# Patient Record
Sex: Male | Born: 1935 | Race: White | Hispanic: No | Marital: Married | Smoking: Never smoker
Health system: Southern US, Community
[De-identification: ages and names within clinical notes are randomized; demographics above are authoritative.]

## PROBLEM LIST (undated history)

## (undated) DIAGNOSIS — K219 Gastro-esophageal reflux disease without esophagitis: Secondary | ICD-10-CM

## (undated) DIAGNOSIS — Z87442 Personal history of urinary calculi: Secondary | ICD-10-CM

## (undated) DIAGNOSIS — E782 Mixed hyperlipidemia: Secondary | ICD-10-CM

## (undated) DIAGNOSIS — T884XXA Failed or difficult intubation, initial encounter: Secondary | ICD-10-CM

## (undated) DIAGNOSIS — I2510 Atherosclerotic heart disease of native coronary artery without angina pectoris: Secondary | ICD-10-CM

## (undated) DIAGNOSIS — E785 Hyperlipidemia, unspecified: Secondary | ICD-10-CM

## (undated) DIAGNOSIS — M1990 Unspecified osteoarthritis, unspecified site: Secondary | ICD-10-CM

## (undated) DIAGNOSIS — I10 Essential (primary) hypertension: Secondary | ICD-10-CM

## (undated) DIAGNOSIS — I252 Old myocardial infarction: Secondary | ICD-10-CM

## (undated) HISTORY — DX: Essential (primary) hypertension: I10

## (undated) HISTORY — DX: Old myocardial infarction: I25.2

## (undated) HISTORY — PX: EYE SURGERY: SHX253

## (undated) HISTORY — DX: Hyperlipidemia, unspecified: E78.5

## (undated) HISTORY — PX: NECK SURGERY: SHX720

## (undated) HISTORY — PX: COLONOSCOPY: SHX174

## (undated) HISTORY — DX: Atherosclerotic heart disease of native coronary artery without angina pectoris: I25.10

## (undated) HISTORY — DX: Mixed hyperlipidemia: E78.2

## (undated) HISTORY — PX: BACK SURGERY: SHX140

---

## 1995-05-11 HISTORY — PX: CARDIAC CATHETERIZATION: SHX172

## 1998-11-12 ENCOUNTER — Encounter: Payer: Self-pay | Admitting: Urology

## 1998-11-12 ENCOUNTER — Emergency Department (HOSPITAL_COMMUNITY): Admission: EM | Admit: 1998-11-12 | Discharge: 1998-11-12 | Payer: Self-pay | Admitting: Emergency Medicine

## 1998-11-18 ENCOUNTER — Ambulatory Visit (HOSPITAL_COMMUNITY): Admission: RE | Admit: 1998-11-18 | Discharge: 1998-11-18 | Payer: Self-pay | Admitting: Urology

## 1998-11-18 ENCOUNTER — Encounter: Payer: Self-pay | Admitting: Urology

## 2005-12-18 ENCOUNTER — Emergency Department (HOSPITAL_COMMUNITY): Admission: EM | Admit: 2005-12-18 | Discharge: 2005-12-18 | Payer: Self-pay | Admitting: Emergency Medicine

## 2007-03-18 IMAGING — CT CT T SPINE W/O CM
3 of 4 series · 16 of 33 positions shown, 19 images · non-contrast
Comparison: Thoracic spine radiographs obtained earlier today.

CLINICAL DATA: Back pain following fall. T11 wedge deformity on radiographs
earlier today.

THORACIC SPINE CT WITHOUT CONTRAST
TECHNIQUE: Multidetector CT imaging of the lower thoracic spine was performed. 
Sagittal and coronal plane reformatted images were reconstructed from the axial
CT data, and were also reviewed.

[Series 4: t_spine 2.0 b30s st · axial · 0.23mm/px · z∈[-115,-24]mm · 8 of 85 slices shown, 10 images]
[im 10/85  soft-tissue]
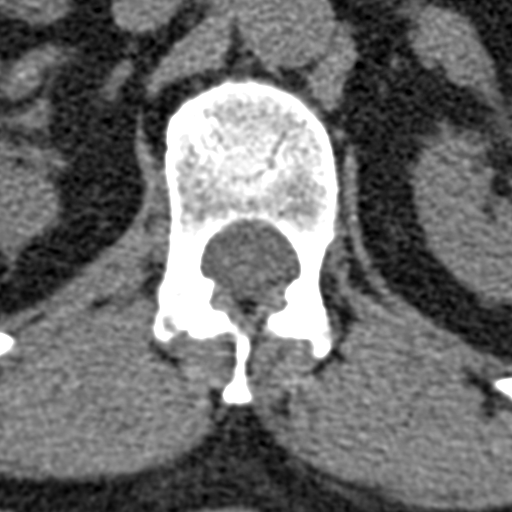
[im 10/85  bone]
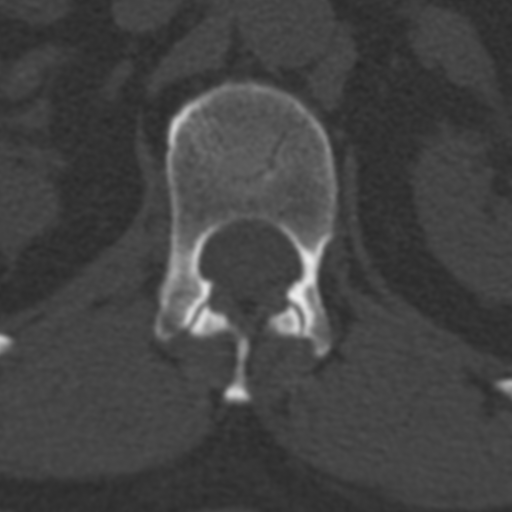
[im 19/85  bone]
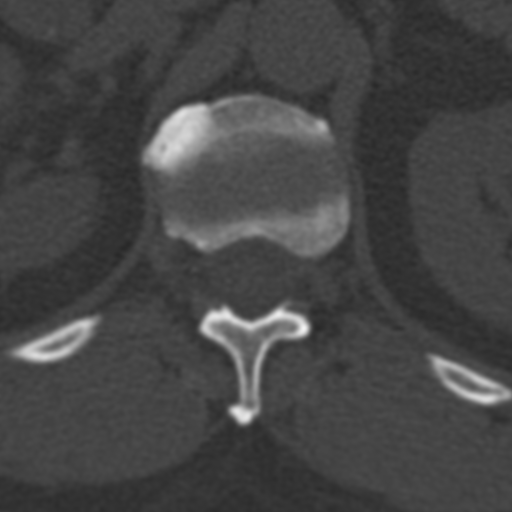
[im 29/85  bone]
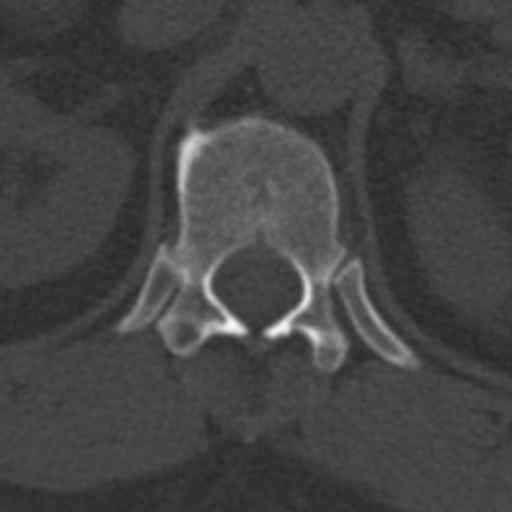
[im 38/85  bone]
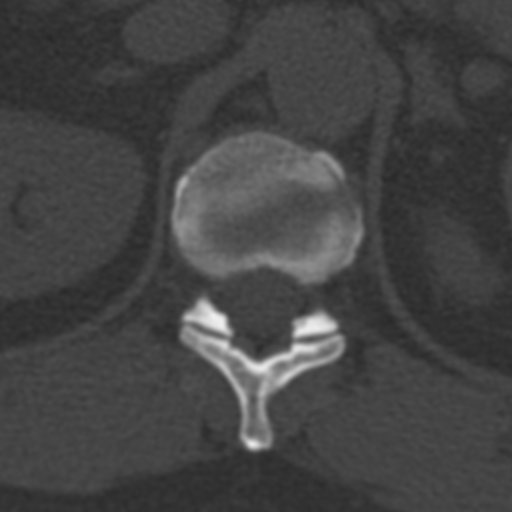
[im 47/85  soft-tissue]
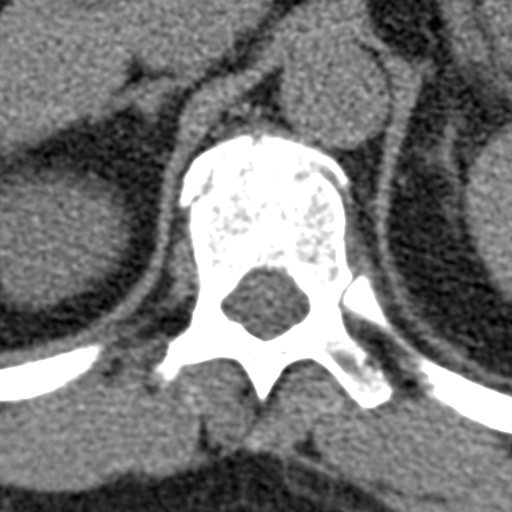
[im 47/85  bone]
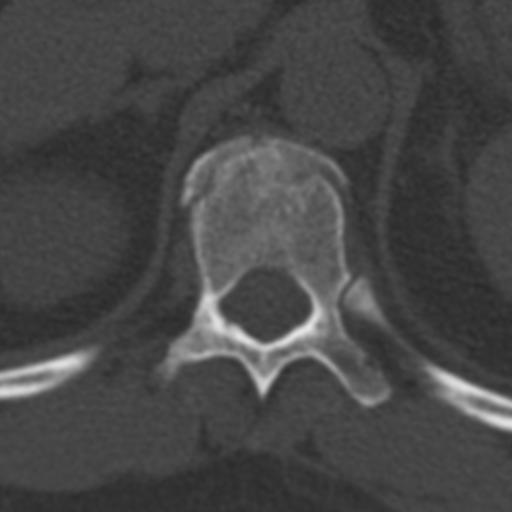
[im 57/85  bone]
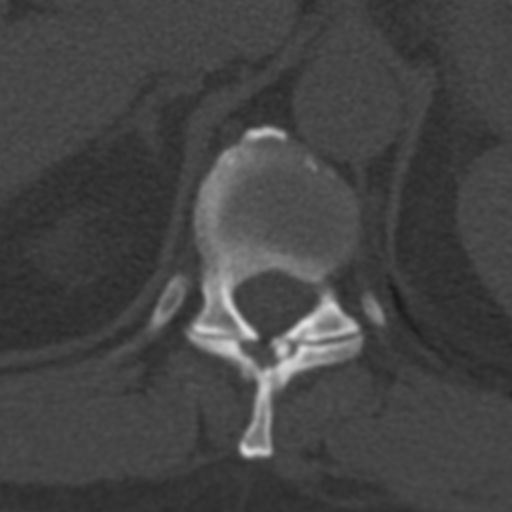
[im 66/85  bone]
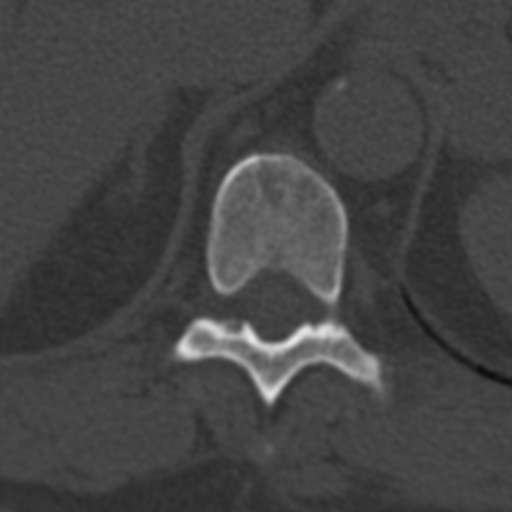
[im 75/85  bone]
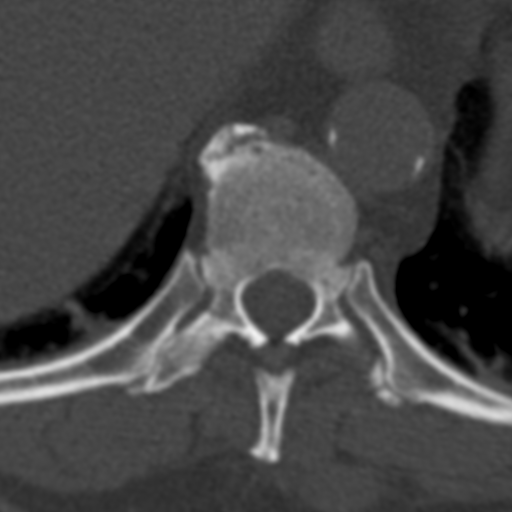

[Series 602: cor · coronal · 0.23mm/px · 3 of 50 slices shown]
[im 10/50  bone]
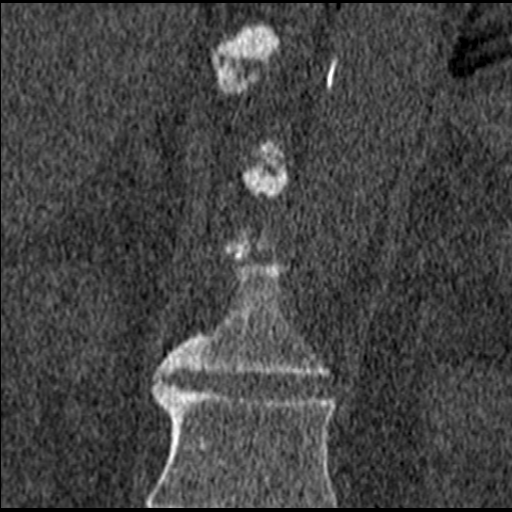
[im 20/50  bone]
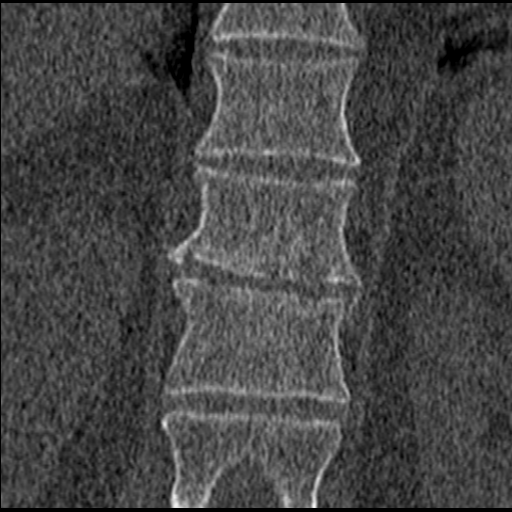
[im 30/50  bone]
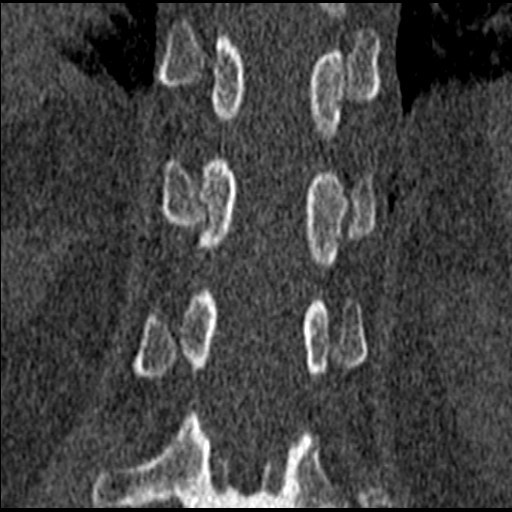

[Series 603: sag · sagittal · 0.23mm/px · 5 of 50 slices shown, 6 images]
[im 17/50  bone]
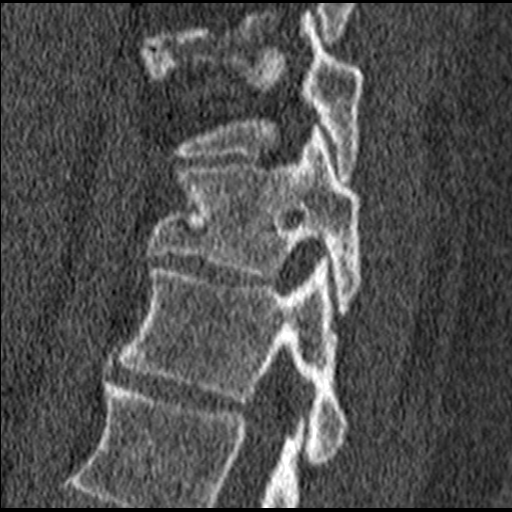
[im 21/50  bone]
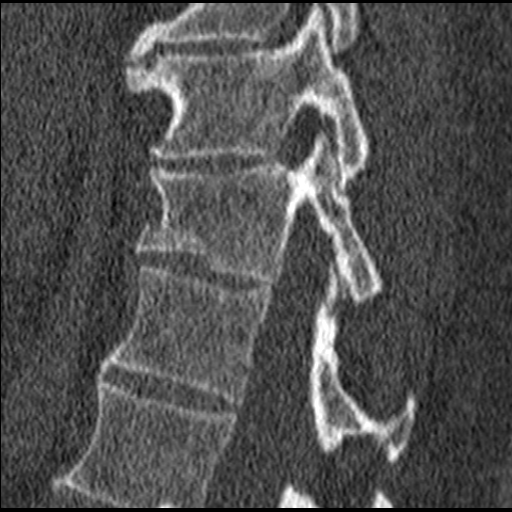
[im 25/50  soft-tissue]
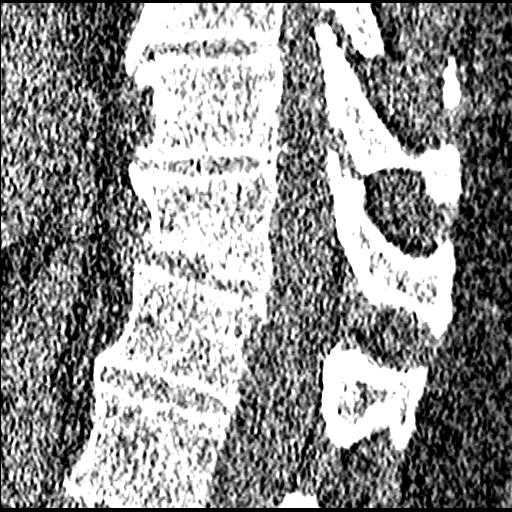
[im 25/50  bone]
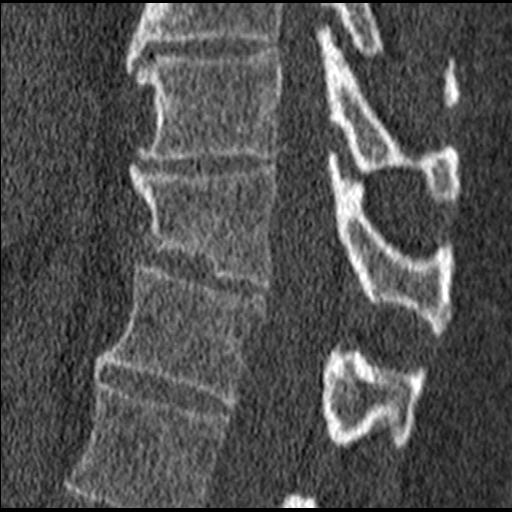
[im 29/50  bone]
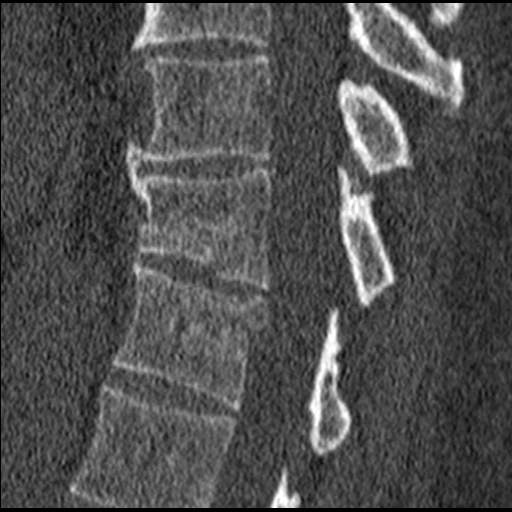
[im 33/50  bone]
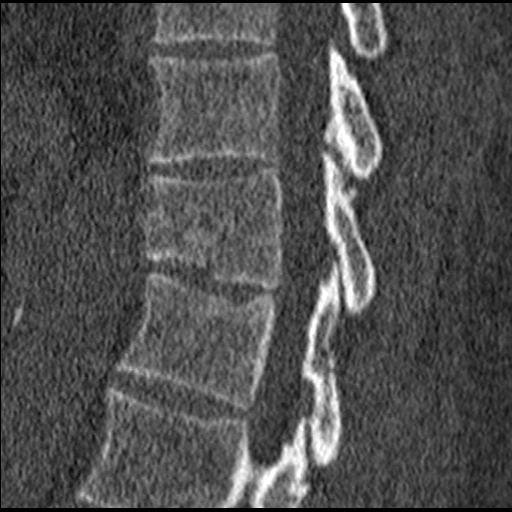

[16 of 33 positions shown; findings below may reference images not displayed]

FINDINGS: Mildly comminuted, acute inferior endplate fracture of the T11
vertebral body anteriorly and on the right. Approximately 15% loss of anterior
height. No bony retropulsion. Anterior spondylosis at multiple levels.

IMPRESSION

Approximately 15% mildly comminuted, acute inferior endplate fracture of the T11
vertebral body without bony retropulsion.

## 2007-07-23 ENCOUNTER — Emergency Department (HOSPITAL_COMMUNITY): Admission: EM | Admit: 2007-07-23 | Discharge: 2007-07-23 | Payer: Self-pay | Admitting: Emergency Medicine

## 2007-07-27 ENCOUNTER — Emergency Department (HOSPITAL_COMMUNITY): Admission: EM | Admit: 2007-07-27 | Discharge: 2007-07-27 | Payer: Self-pay | Admitting: Emergency Medicine

## 2010-09-15 ENCOUNTER — Ambulatory Visit: Payer: Self-pay | Admitting: Cardiovascular Disease

## 2011-02-24 ENCOUNTER — Other Ambulatory Visit: Payer: Self-pay | Admitting: *Deleted

## 2011-02-24 DIAGNOSIS — E7800 Pure hypercholesterolemia, unspecified: Secondary | ICD-10-CM

## 2011-03-17 ENCOUNTER — Encounter: Payer: Self-pay | Admitting: Cardiovascular Disease

## 2011-03-17 DIAGNOSIS — I252 Old myocardial infarction: Secondary | ICD-10-CM | POA: Insufficient documentation

## 2011-03-17 DIAGNOSIS — I2510 Atherosclerotic heart disease of native coronary artery without angina pectoris: Secondary | ICD-10-CM | POA: Insufficient documentation

## 2011-03-17 DIAGNOSIS — E785 Hyperlipidemia, unspecified: Secondary | ICD-10-CM | POA: Insufficient documentation

## 2011-03-17 DIAGNOSIS — I10 Essential (primary) hypertension: Secondary | ICD-10-CM | POA: Insufficient documentation

## 2011-03-19 ENCOUNTER — Ambulatory Visit (INDEPENDENT_AMBULATORY_CARE_PROVIDER_SITE_OTHER): Payer: Medicare Other | Admitting: Cardiovascular Disease

## 2011-03-19 ENCOUNTER — Other Ambulatory Visit (INDEPENDENT_AMBULATORY_CARE_PROVIDER_SITE_OTHER): Payer: Medicare Other | Admitting: *Deleted

## 2011-03-19 ENCOUNTER — Encounter: Payer: Self-pay | Admitting: Cardiovascular Disease

## 2011-03-19 VITALS — BP 134/80 | HR 58 | Ht 67.0 in | Wt 199.2 lb

## 2011-03-19 DIAGNOSIS — E785 Hyperlipidemia, unspecified: Secondary | ICD-10-CM

## 2011-03-19 DIAGNOSIS — E7800 Pure hypercholesterolemia, unspecified: Secondary | ICD-10-CM

## 2011-03-19 DIAGNOSIS — I2510 Atherosclerotic heart disease of native coronary artery without angina pectoris: Secondary | ICD-10-CM

## 2011-03-19 LAB — BASIC METABOLIC PANEL
BUN: 16 mg/dL (ref 6–23)
CO2: 29 mEq/L (ref 19–32)
Calcium: 9.3 mg/dL (ref 8.4–10.5)
GFR: 61.91 mL/min (ref >60.00)
Glucose, Bld: 106 mg/dL — ABNORMAL HIGH (ref 70–99)

## 2011-03-19 LAB — LIPID PANEL
Cholesterol: 108 mg/dL (ref 0–200)
HDL: 27.4 mg/dL — ABNORMAL LOW (ref >39.00)
VLDL: 20.2 mg/dL (ref 0.0–40.0)

## 2011-03-19 LAB — HEPATIC FUNCTION PANEL
Albumin: 3.9 g/dL (ref 3.5–5.2)
Total Protein: 6.3 g/dL (ref 6.0–8.3)

## 2011-03-19 NOTE — Assessment & Plan Note (Signed)
Clinton Garza is doing well from a cardiac standpoint. We'll continue with his same medications. I'll see him again in 6 months for office visit, lipid levels, hepatic profile, and basic metabolic profile.

## 2011-03-19 NOTE — Progress Notes (Signed)
Clinton Garza Date of Birth  [birth year] [facility] [place] [address]     [address] [place], [place]  [address] [phone]  Fax  [phone]  History of Present Illness:  No cardiac complaints, complains of back pain.    Current Outpatient Prescriptions on File Prior to Visit  Medication Sig Dispense Refill  . fenofibrate (TRICOR) 145 MG tablet Take 145 mg by mouth daily.        Marland Kitchen lisinopril (PRINIVIL,ZESTRIL) 10 MG tablet Take 10 mg by mouth daily.        . nitroGLYCERIN (NITROSTAT) 0.4 MG SL tablet Place 0.4 mg under the tongue every 5 (five) minutes as needed.        . rosuvastatin (CRESTOR) 10 MG tablet Take 10 mg by mouth daily.        Marland Kitchen DISCONTD: aspirin 81 MG tablet Take 81 mg by mouth daily.        Marland Kitchen DISCONTD: celecoxib (CELEBREX) 200 MG capsule Take 200 mg by mouth daily.        Marland Kitchen DISCONTD: Multiple Vitamin (MULTIVITAMIN) tablet Take 1 tablet by mouth daily.        Marland Kitchen DISCONTD: Multiple Vitamins-Minerals (ICAPS PO) Take by mouth daily.          Allergies  Allergen Reactions  . Antivert (Meclizine Hcl)   . Iodinated Diagnostic Agents     Past Medical History  Diagnosis Date  . Coronary artery disease   . Hypertension   . Dyslipidemia   . 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 Cardiac catheterization 05/11/1995    normal left ventricular systolic function with ef of 70-75%    History  Smoking status  . Never Smoker   Smokeless tobacco  . Not on file    History  Alcohol Use No    No family history on file.  Reviw of Systems:  Reviewed in the HPI.  All other systems are negative.  Physical Exam: BP 134/80  Pulse 58  Ht 5\' 7"  (1.702 m)  Wt 199 lb 3.2 oz (90.357 kg)  BMI 31.20 kg/m2 The patient is alert and oriented x 3.  The mood and affect are normal.  The skin is warm and dry.  Color is normal.  The HEENT exam reveals that the sclera are nonicteric.  The mucous membranes are  moist.  The carotids are 2+ without bruits.  There is no thyromegaly.  There is no JVD.  The lungs are clear.  The chest wall is non tender.  The heart exam reveals a regular rate with a normal S1 and S2.  There are no murmurs, gallops, or rubs.  The PMI is not displaced.   Abdominal exam reveals good bowel sounds.  There is no guarding or rebound.  There is no hepatosplenomegaly or tenderness.  There are no masses.  Exam of the legs reveal no clubbing, cyanosis, or edema.  The legs are without rashes.  The distal pulses are intact.  Cranial nerves II - XII are intact.  Motor and sensory functions are intact.  The gait is normal.  Assessment / Plan:

## 2011-03-19 NOTE — Assessment & Plan Note (Signed)
He should  continue with the fenofibrate. He should continue to eat low fat diet.

## 2011-03-25 ENCOUNTER — Telehealth: Payer: Self-pay | Admitting: *Deleted

## 2011-03-25 NOTE — Telephone Encounter (Signed)
msg left with lab results and to call back if any further Gordy Clement RN

## 2011-04-09 ENCOUNTER — Other Ambulatory Visit: Payer: Self-pay | Admitting: Cardiovascular Disease

## 2011-07-22 ENCOUNTER — Other Ambulatory Visit: Payer: Self-pay | Admitting: Cardiovascular Disease

## 2011-07-23 ENCOUNTER — Other Ambulatory Visit: Payer: Self-pay | Admitting: *Deleted

## 2011-07-23 MED ORDER — FENOFIBRATE 145 MG PO TABS: 145.0000 mg | ORAL_TABLET | Freq: Every day | ORAL | Status: DC

## 2011-07-23 NOTE — Telephone Encounter (Signed)
Fax received from pharmacy. Refill completed. Jodette Stepehn Eckard RN  

## 2011-09-24 ENCOUNTER — Ambulatory Visit: Payer: Medicare Other | Admitting: Cardiovascular Disease

## 2011-09-25 ENCOUNTER — Ambulatory Visit (INDEPENDENT_AMBULATORY_CARE_PROVIDER_SITE_OTHER): Payer: Medicare Other | Admitting: Cardiovascular Disease

## 2011-09-25 ENCOUNTER — Encounter: Payer: Self-pay | Admitting: Cardiovascular Disease

## 2011-09-25 DIAGNOSIS — E7800 Pure hypercholesterolemia, unspecified: Secondary | ICD-10-CM

## 2011-09-25 DIAGNOSIS — I10 Essential (primary) hypertension: Secondary | ICD-10-CM

## 2011-09-25 DIAGNOSIS — E785 Hyperlipidemia, unspecified: Secondary | ICD-10-CM

## 2011-09-25 DIAGNOSIS — I2510 Atherosclerotic heart disease of native coronary artery without angina pectoris: Secondary | ICD-10-CM

## 2011-09-25 LAB — BASIC METABOLIC PANEL
BUN: 21 mg/dL (ref 6–23)
CO2: 27 mEq/L (ref 19–32)
Calcium: 9.4 mg/dL (ref 8.4–10.5)
GFR: 46.55 mL/min — ABNORMAL LOW (ref >60.00)
Glucose, Bld: 111 mg/dL — ABNORMAL HIGH (ref 70–99)
Potassium: 4.1 mEq/L (ref 3.5–5.1)

## 2011-09-25 LAB — HEPATIC FUNCTION PANEL
ALT: 29 U/L (ref 0–53)
Bilirubin, Direct: 0.2 mg/dL (ref 0.0–0.3)
Total Bilirubin: 0.9 mg/dL (ref 0.3–1.2)

## 2011-09-25 LAB — LIPID PANEL
Total CHOL/HDL Ratio: 4
VLDL: 12.2 mg/dL (ref 0.0–40.0)

## 2011-09-25 MED ORDER — LISINOPRIL 20 MG PO TABS: 20.0000 mg | ORAL_TABLET | Freq: Every day | ORAL | Status: DC

## 2011-09-25 NOTE — Assessment & Plan Note (Signed)
His HDL remains low. I've encouraged him to exercise on regular basis. We'll check his lipid levels today.

## 2011-09-25 NOTE — Assessment & Plan Note (Signed)
Patient's blood pressure is mildly elevated. We will increase his lisinopril to 20 mg a day.  We'll see him again in 3 months for an office visit and basic metabolic profile.

## 2011-09-25 NOTE — Patient Instructions (Addendum)
Your physician wants you to follow-up in: 3 months You will receive a reminder letter in the mail two months in advance. If you don't receive a letter, please call our office to schedule the follow-up appointment.  Your physician recommends that you return for a FASTING lipid profile: TODAY  Your physician recommends that you return for lab work in: 3 months bmet  Your physician has recommended you make the following change in your medication:   Increase lisinopril to 20 mg a day

## 2011-09-25 NOTE — Progress Notes (Signed)
    Clinton Garza Date of Birth  11-14-1935 [facility] [address]    [address] [place], [place]  [address] [phone]  Fax  [phone]  History of Present Illness:  Clinton Garza is a 75 year old gentleman with a history of coronary artery disease. He status post anterior wall myocardial infarction in 1996. We performed PTCA of his left anterior descending artery.  A stress Myoview study performed in 2000 it reveals anteroapical scar but no evidence of ischemia. His left ventricular systolic function was normal at 56%. An echocardiogram performed in May of 2001 reveals an ejection fraction between 55 and 60%. He has hypokinesis and calcification of the apex at the site of his old apical myocardial infarction.  Current Outpatient Prescriptions on File Prior to Visit  Medication Sig Dispense Refill  . aspirin 81 MG tablet Take 81 mg by mouth daily.        . fenofibrate (TRICOR) 145 MG tablet Take 1 tablet (145 mg total) by mouth daily.  30 tablet  5  . lisinopril (PRINIVIL,ZESTRIL) 10 MG tablet TAKE 1 TABLET EVERY DAY  30 tablet  10  . nitroGLYCERIN (NITROSTAT) 0.4 MG SL tablet Place 0.4 mg under the tongue every 5 (five) minutes as needed.        . rosuvastatin (CRESTOR) 10 MG tablet Take 10 mg by mouth daily.          Allergies  Allergen Reactions  . Antivert (Meclizine Hcl)   . Iodinated Diagnostic Agents     Past Medical History  Diagnosis Date  . Coronary artery disease   . Hypertension   . Dyslipidemia   . 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 Cardiac catheterization 05/11/1995    normal left ventricular systolic function with ef of 70-75%    History  Smoking status  . Never Smoker   Smokeless tobacco  . Not on file    History  Alcohol Use No    History reviewed. No pertinent family history.  Reviw of Systems:  Reviewed in the HPI.  All other systems are negative.  Physical Exam: BP 147/79  Pulse 71  Ht 5\' 7"   (1.702 m)  Wt 201 lb 1.9 oz (91.227 kg)  BMI 31.50 kg/m2 The patient is alert and oriented x 3.  The mood and affect are normal.   Skin: warm and dry.  Color is normal.    HEENT:   Normocephalic/atraumatic. History JVD. His neck is supple.  Lungs: His lung exam is clear.   Heart: Regular rate S1-S2. There is no murmurs.    Abdomen: Good bowel sounds.  Extremities:  No clubbing cyanosis or edema.  Neuro:  Neuro exam is nonfocal. Gait is normal.    ECG: His EKG reveals normal sinus rhythm. He has an old anterior myocardial infarction. No change from his previous tracing.  Assessment / Plan:

## 2011-09-25 NOTE — Assessment & Plan Note (Signed)
Has a history of an old into wall myocardial infarction. He's not having any symptoms of anginal congestive heart failure. We'll keep a close eye on his lipids. His HDL levels have been somewhat low. I have encouraged him to exercise on regular basis. We'll check his lipids today.

## 2011-11-27 ENCOUNTER — Other Ambulatory Visit: Payer: Self-pay

## 2011-11-27 MED ORDER — ROSUVASTATIN CALCIUM 10 MG PO TABS: 10.0000 mg | ORAL_TABLET | Freq: Every day | ORAL | Status: DC

## 2011-11-27 NOTE — Telephone Encounter (Signed)
..  refills on crestor 10 mg

## 2011-12-24 ENCOUNTER — Ambulatory Visit (INDEPENDENT_AMBULATORY_CARE_PROVIDER_SITE_OTHER): Payer: Medicare Other | Admitting: Cardiovascular Disease

## 2011-12-24 ENCOUNTER — Encounter: Payer: Self-pay | Admitting: Cardiovascular Disease

## 2011-12-24 VITALS — BP 140/80 | HR 63 | Ht 66.0 in | Wt 205.2 lb

## 2011-12-24 DIAGNOSIS — I2510 Atherosclerotic heart disease of native coronary artery without angina pectoris: Secondary | ICD-10-CM

## 2011-12-24 DIAGNOSIS — E785 Hyperlipidemia, unspecified: Secondary | ICD-10-CM

## 2011-12-24 LAB — HEPATIC FUNCTION PANEL
ALT: 32 U/L (ref 0–53)
Bilirubin, Direct: 0.2 mg/dL (ref 0.0–0.3)
Total Bilirubin: 0.9 mg/dL (ref 0.3–1.2)

## 2011-12-24 LAB — BASIC METABOLIC PANEL
BUN: 18 mg/dL (ref 6–23)
CO2: 26 mEq/L (ref 19–32)
GFR: 62.37 mL/min (ref >60.00)
Glucose, Bld: 101 mg/dL — ABNORMAL HIGH (ref 70–99)
Potassium: 4.4 mEq/L (ref 3.5–5.1)
Sodium: 138 mEq/L (ref 135–145)

## 2011-12-24 LAB — LIPID PANEL
HDL: 30.3 mg/dL — ABNORMAL LOW (ref >39.00)
LDL Cholesterol: 71 mg/dL (ref 0–99)
Total CHOL/HDL Ratio: 4
VLDL: 19.4 mg/dL (ref 0.0–40.0)

## 2011-12-24 NOTE — Progress Notes (Signed)
    Gerri Lins Date of Birth  December 19, 1935 [facility] [address]    [address] [place], [place]  [address] [phone]  Fax  [phone]  Problem list: 1. Coronary artery disease-status post anterior wall myocardial infarction in 1996. He status post PTCA of the LAD 2. Hyperlipidemia   History of Present Illness:  Rondey is a 76 year old gentleman with a history of coronary artery disease. He status post anterior wall myocardial infarction in 1996. We performed PTCA of his left anterior descending artery.  A stress Myoview study performed in 2000 it reveals anteroapical scar but no evidence of ischemia. His left ventricular systolic function was normal at 56%. An echocardiogram performed in May of 2001 reveals an ejection fraction between 55 and 60%. He has hypokinesis and calcification of the apex at the site of his old apical myocardial infarction.  He has gained some weight.  No chest pain.  Current Outpatient Prescriptions on File Prior to Visit  Medication Sig Dispense Refill  . aspirin 81 MG tablet Take 81 mg by mouth daily.        . Celecoxib (CELEBREX PO) Take 200 mg by mouth daily.       . fenofibrate (TRICOR) 145 MG tablet Take 1 tablet (145 mg total) by mouth daily.  30 tablet  5  . lisinopril (PRINIVIL,ZESTRIL) 20 MG tablet Take 1 tablet (20 mg total) by mouth daily.  30 tablet  11  . nitroGLYCERIN (NITROSTAT) 0.4 MG SL tablet Place 0.4 mg under the tongue every 5 (five) minutes as needed.        . rosuvastatin (CRESTOR) 10 MG tablet Take 1 tablet (10 mg total) by mouth daily.  30 tablet  2     Allergies  Allergen Reactions  . Antivert (Meclizine Hcl)   . Iodinated Diagnostic Agents     Past Medical History  Diagnosis Date  . Coronary artery disease   . Hypertension   . Dyslipidemia   . 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 Cardiac catheterization 05/11/1995    normal left ventricular systolic function with ef of  69-62%    History  Smoking status  . Never Smoker   Smokeless tobacco  . Not on file    History  Alcohol Use No    No family history on file.  Reviw of Systems:  Reviewed in the HPI.  All other systems are negative.  Physical Exam: BP 140/80  Pulse 63  Ht 5\' 6"  (1.676 m)  Wt 205 lb 3.2 oz (93.078 kg)  BMI 33.12 kg/m2 The patient is alert and oriented x 3.  The mood and affect are normal.   Skin: warm and dry.  Color is normal.    HEENT:   Normocephalic/atraumatic. History JVD. His neck is supple.  Lungs: His lung exam is clear.   Heart: Regular rate S1-S2. There is no murmurs.    Abdomen: Good bowel sounds.  Extremities:  No clubbing cyanosis or edema.  Neuro:  Neuro exam is nonfocal. Gait is normal.    ECG: His EKG reveals normal sinus rhythm. He has an old anterior myocardial infarction. No change from his previous tracing.  Assessment / Plan:

## 2011-12-24 NOTE — Assessment & Plan Note (Signed)
Clinton Garza has done for well. He's not having any episodes of chest pain or shortness of breath. We'll continue the same medications. I have asked him to exercise on a regular basis in an effort to lose some weight. Fortunately he's not having any problems

## 2011-12-24 NOTE — Patient Instructions (Signed)
Your physician recommends that you return for a FASTING lipid profile: today and in 6 months   Your physician wants you to follow-up in: 6 months  You will receive a reminder letter in the mail two months in advance. If you don't receive a letter, please call our office to schedule the follow-up appointment.   

## 2012-03-10 ENCOUNTER — Other Ambulatory Visit: Payer: Self-pay | Admitting: Cardiovascular Disease

## 2012-03-10 MED ORDER — FENOFIBRATE 145 MG PO TABS: 145.0000 mg | ORAL_TABLET | Freq: Every day | ORAL | Status: DC

## 2012-03-10 MED ORDER — ROSUVASTATIN CALCIUM 10 MG PO TABS: 10.0000 mg | ORAL_TABLET | Freq: Every day | ORAL | Status: DC

## 2012-07-18 ENCOUNTER — Telehealth: Payer: Self-pay | Admitting: Cardiovascular Disease

## 2012-07-18 ENCOUNTER — Other Ambulatory Visit: Payer: Self-pay | Admitting: *Deleted

## 2012-07-18 DIAGNOSIS — E785 Hyperlipidemia, unspecified: Secondary | ICD-10-CM

## 2012-07-18 NOTE — Progress Notes (Signed)
Orders completed for fasting labs

## 2012-07-18 NOTE — Telephone Encounter (Signed)
Done see orders.

## 2012-07-18 NOTE — Telephone Encounter (Signed)
New problem:  Need Fasting lab put into the system prior to appt in Oct.

## 2012-07-28 ENCOUNTER — Ambulatory Visit (INDEPENDENT_AMBULATORY_CARE_PROVIDER_SITE_OTHER): Payer: Medicare Other | Admitting: Cardiovascular Disease

## 2012-07-28 ENCOUNTER — Encounter: Payer: Self-pay | Admitting: Cardiovascular Disease

## 2012-07-28 VITALS — BP 149/83 | HR 64 | Ht 66.0 in | Wt 200.1 lb

## 2012-07-28 DIAGNOSIS — I10 Essential (primary) hypertension: Secondary | ICD-10-CM

## 2012-07-28 DIAGNOSIS — I2510 Atherosclerotic heart disease of native coronary artery without angina pectoris: Secondary | ICD-10-CM

## 2012-07-28 DIAGNOSIS — E785 Hyperlipidemia, unspecified: Secondary | ICD-10-CM

## 2012-07-28 LAB — BASIC METABOLIC PANEL
CO2: 28 mEq/L (ref 19–32)
Chloride: 106 mEq/L (ref 96–112)
Creatinine, Ser: 1.4 mg/dL (ref 0.4–1.5)
Sodium: 140 mEq/L (ref 135–145)

## 2012-07-28 LAB — HEPATIC FUNCTION PANEL
ALT: 30 U/L (ref 0–53)
AST: 37 U/L (ref 0–37)
Alkaline Phosphatase: 33 U/L — ABNORMAL LOW (ref 39–117)
Bilirubin, Direct: 0.2 mg/dL (ref 0.0–0.3)
Total Bilirubin: 0.9 mg/dL (ref 0.3–1.2)
Total Protein: 6.8 g/dL (ref 6.0–8.3)

## 2012-07-28 LAB — LIPID PANEL
LDL Cholesterol: 85 mg/dL (ref 0–99)
Total CHOL/HDL Ratio: 4
Triglycerides: 70 mg/dL (ref 0.0–149.0)

## 2012-07-28 NOTE — Patient Instructions (Addendum)
Your physician wants you to follow-up in: 6 months You will receive a reminder letter in the mail two months in advance. If you don't receive a letter, please call our office to schedule the follow-up appointment.  Your physician recommends that you return for a FASTING lipid profile: today bmet lipid hepatic

## 2012-07-28 NOTE — Progress Notes (Signed)
    Clinton Garza Date of Birth  1936/04/26 [facility] [address]    [address] [place], [place]  [address] [phone]  Fax  [phone]  Problem list: 1. Coronary artery disease-status post anterior wall myocardial infarction in 1996. He status post PTCA of the LAD 2. Hyperlipidemia   History of Present Illness:  Clinton Garza is a 76 year old gentleman with a history of coronary artery disease. He status post anterior wall myocardial infarction in 1996. We performed PTCA of his left anterior descending artery.  A stress Myoview study performed in 2000 it reveals anteroapical scar but no evidence of ischemia. His left ventricular systolic function was normal at 56%. An echocardiogram performed in May of 2001 reveals an ejection fraction between 55 and 60%. He has hypokinesis and calcification of the apex at the site of his old apical myocardial infarction.  He has gained some weight.  No chest pain.   Oct. 3, 2013 - He has been busy - he bought a house at [facility] and has been remodeling the house.  He has not been getting as much exercise as he would like.    Current Outpatient Prescriptions on File Prior to Visit  Medication Sig Dispense Refill  . aspirin 81 MG tablet Take 81 mg by mouth daily.        . Celecoxib (CELEBREX PO) Take 200 mg by mouth daily.       . fenofibrate (TRICOR) 145 MG tablet Take 1 tablet (145 mg total) by mouth daily.  30 tablet  5  . lisinopril (PRINIVIL,ZESTRIL) 20 MG tablet Take 1 tablet (20 mg total) by mouth daily.  30 tablet  11  . nitroGLYCERIN (NITROSTAT) 0.4 MG SL tablet Place 0.4 mg under the tongue every 5 (five) minutes as needed.        . rosuvastatin (CRESTOR) 10 MG tablet Take 1 tablet (10 mg total) by mouth daily.  30 tablet  8     Allergies  Allergen Reactions  . Antivert (Meclizine Hcl)   . Iodinated Diagnostic Agents     Past Medical History  Diagnosis Date  . Coronary artery disease   . Hypertension   .  Dyslipidemia   . 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 Cardiac catheterization 05/11/1995    normal left ventricular systolic function with ef of 70-75%    History  Smoking status  . Never Smoker   Smokeless tobacco  . Not on file    History  Alcohol Use No    No family history on file.  Reviw of Systems:  Reviewed in the HPI.  All other systems are negative.  Physical Exam: BP 149/83  Pulse 64  Ht 5\' 6"  (1.676 m)  Wt 200 lb 1.9 oz (90.774 kg)  BMI 32.30 kg/m2 The patient is alert and oriented x 3.  The mood and affect are normal.   Skin: warm and dry.  Color is normal.    HEENT:   Normocephalic/atraumatic. History JVD. His neck is supple.  Lungs: His lung exam is clear.   Heart: Regular rate S1-S2. There is no murmurs.    Abdomen: Good bowel sounds.  Extremities:  No clubbing cyanosis or edema.  Neuro:  Neuro exam is nonfocal. Gait is normal.    ECG: Oct. 3, 2013 - sinus brady at 26. Old ant. MI.  Assessment / Plan:

## 2012-07-29 NOTE — Assessment & Plan Note (Signed)
Clinton Garza has done for well. He's not having any episodes of chest pain or shortness of breath.  I have asked him to exercise on a regular basis in an effort to lose some weight. Fortunately he's not having any problems.  We'll continue the same medications.

## 2012-08-02 ENCOUNTER — Encounter: Payer: Self-pay | Admitting: Cardiovascular Disease

## 2012-10-28 ENCOUNTER — Other Ambulatory Visit: Payer: Self-pay | Admitting: Cardiovascular Disease

## 2012-10-28 DIAGNOSIS — I10 Essential (primary) hypertension: Secondary | ICD-10-CM

## 2012-10-28 DIAGNOSIS — I2510 Atherosclerotic heart disease of native coronary artery without angina pectoris: Secondary | ICD-10-CM

## 2012-10-28 MED ORDER — LISINOPRIL 20 MG PO TABS: 20.0000 mg | ORAL_TABLET | Freq: Every day | ORAL | Status: DC

## 2012-12-10 ENCOUNTER — Other Ambulatory Visit: Payer: Self-pay

## 2013-02-21 ENCOUNTER — Other Ambulatory Visit: Payer: Self-pay | Admitting: Emergency Medicine

## 2013-02-21 MED ORDER — ROSUVASTATIN CALCIUM 10 MG PO TABS: 10.0000 mg | ORAL_TABLET | Freq: Every day | ORAL | Status: DC

## 2013-05-29 ENCOUNTER — Other Ambulatory Visit: Payer: Self-pay

## 2013-05-29 MED ORDER — FENOFIBRATE 145 MG PO TABS: 145.0000 mg | ORAL_TABLET | Freq: Every day | ORAL | Status: DC

## 2013-05-31 ENCOUNTER — Other Ambulatory Visit: Payer: Self-pay

## 2013-06-06 ENCOUNTER — Telehealth: Payer: Self-pay | Admitting: Cardiovascular Disease

## 2013-06-06 NOTE — Telephone Encounter (Signed)
Returned call to patient he stated he went to pharmacy last night and he cannot afford crestor and fenofibrate( Tricor ).Patient stated he can get atorvastatin cheaper and wanted to know what else can he take instead of fenofibrate.Message sent to Dr.Nahser.

## 2013-06-06 NOTE — Telephone Encounter (Signed)
Atorvastatin 80 instead of crestor ( to save money at patients request) Can Dc fenofibrate - there are no cheaper alternatives

## 2013-06-06 NOTE — Telephone Encounter (Signed)
New Prob     Pt wants to know the difference between LIPITOR and CRESTOR. Please call.

## 2013-06-07 MED ORDER — ATORVASTATIN CALCIUM 80 MG PO TABS: 80.0000 mg | ORAL_TABLET | Freq: Every day | ORAL | Status: DC

## 2013-06-07 NOTE — Telephone Encounter (Signed)
Pt informed/ pharmacy notified

## 2013-06-08 ENCOUNTER — Telehealth: Payer: Self-pay | Admitting: Cardiovascular Disease

## 2013-06-08 NOTE — Telephone Encounter (Signed)
New Prob  Pt has a question regarding a medicine he was prescribed. He wants to make sure the dosage is correct.

## 2013-06-08 NOTE — Telephone Encounter (Signed)
Confirmed/ atorvastatin is to be 80 mg, he verbalized understanding.

## 2013-06-14 ENCOUNTER — Telehealth: Payer: Self-pay | Admitting: Cardiovascular Disease

## 2013-06-14 DIAGNOSIS — E785 Hyperlipidemia, unspecified: Secondary | ICD-10-CM

## 2013-06-14 NOTE — Telephone Encounter (Signed)
C/o hives since Saturday eve/ trunk of body and limbs, denies difficulty swallowing. Pt was told to stop atorvastatin and take benadryl, cautioned him to benadryl side effects of sleepiness and increased fall risk.  Told him he will receive a call next week with advice regarding new cholesterol med. Pt was told to call pcp with further need. Pt agreed to plan.

## 2013-06-14 NOTE — Telephone Encounter (Signed)
Med was changed last week, pt having hives since sunday

## 2013-07-10 NOTE — Telephone Encounter (Signed)
Pt declines restarting crestor per Dr Harvie Bridge suggestion. Pt wants to wait to be seen/ has app/ and will have labs day prior/ orders placed.

## 2013-08-15 ENCOUNTER — Other Ambulatory Visit (INDEPENDENT_AMBULATORY_CARE_PROVIDER_SITE_OTHER): Payer: Medicare Other

## 2013-08-15 DIAGNOSIS — E785 Hyperlipidemia, unspecified: Secondary | ICD-10-CM

## 2013-08-15 LAB — LIPID PANEL: Total CHOL/HDL Ratio: 5

## 2013-08-15 LAB — BASIC METABOLIC PANEL
CO2: 30 mEq/L (ref 19–32)
Calcium: 9 mg/dL (ref 8.4–10.5)
Creatinine, Ser: 1 mg/dL (ref 0.4–1.5)
Glucose, Bld: 110 mg/dL — ABNORMAL HIGH (ref 70–99)

## 2013-08-15 LAB — HEPATIC FUNCTION PANEL
AST: 29 U/L (ref 0–37)
Alkaline Phosphatase: 45 U/L (ref 39–117)
Bilirubin, Direct: 0.1 mg/dL (ref 0.0–0.3)
Total Bilirubin: 0.8 mg/dL (ref 0.3–1.2)

## 2013-08-16 ENCOUNTER — Ambulatory Visit (INDEPENDENT_AMBULATORY_CARE_PROVIDER_SITE_OTHER): Payer: Medicare Other | Admitting: Cardiovascular Disease

## 2013-08-16 ENCOUNTER — Encounter: Payer: Self-pay | Admitting: Cardiovascular Disease

## 2013-08-16 VITALS — BP 148/88 | HR 60 | Ht 67.0 in | Wt 202.0 lb

## 2013-08-16 DIAGNOSIS — I10 Essential (primary) hypertension: Secondary | ICD-10-CM

## 2013-08-16 DIAGNOSIS — I2510 Atherosclerotic heart disease of native coronary artery without angina pectoris: Secondary | ICD-10-CM

## 2013-08-16 DIAGNOSIS — E785 Hyperlipidemia, unspecified: Secondary | ICD-10-CM

## 2013-08-16 MED ORDER — NITROGLYCERIN 0.4 MG SL SUBL: 0.4000 mg | SUBLINGUAL_TABLET | SUBLINGUAL | Status: DC | PRN

## 2013-08-16 NOTE — Assessment & Plan Note (Signed)
Unfortunately, Nana did not tolerate the atorvastatin. He broke out in a typical drug rash. He did tolerate Crestor. Crestor is too expensive for him   at this time because he fell into the doughnut hole.  In January he will call back. We will give him Crestor 40 mg tablets. He's to take one half tablet each day we'll give him 15 tablets per month. Hopefully this will be less expensive for him.  He still eats fried foods and salty foods. I strongly advised him to stop eating all fried foods. He needs to eat baked or broiled/  grilled chicken or fish.  I have encouraged him to work on a good diet, exercise, and weight loss program.  Sig in 6 months for followup followup office visit and lipid profile, H&P, basic metabolic profile.

## 2013-08-16 NOTE — Progress Notes (Signed)
Clinton Garza Date of Birth  1936-02-12 [facility] [address]    [address] [place], [place]  [address] [phone]  Fax  [phone]  Problem list: 1. Coronary artery disease-status post anterior wall myocardial infarction in 1996. He status post PTCA of the LAD 2. Hyperlipidemia   History of Present Illness:  Clinton Garza is a 77 year old gentleman with a history of coronary artery disease. He status post anterior wall myocardial infarction in 1996. We performed PTCA of his left anterior descending artery.  A stress Myoview study performed in 2000 it reveals anteroapical scar but no evidence of ischemia. His left ventricular systolic function was normal at 56%. An echocardiogram performed in May of 2001 reveals an ejection fraction between 55 and 60%. He has hypokinesis and calcification of the apex at the site of his old apical myocardial infarction.  He has gained some weight.  No chest pain.   Oct. 3, 2013 - He has been busy - he bought a house at [facility] and has been remodeling the house.  He has not been getting as much exercise as he would like.  Oct. 22, 2014    Clinton Garza is doing well.  Still working on his [employer] [place] house.    He still eats some salt.   He denies any angina.   He was placed on atorvastatin 80 mg a day. Unfortunately, he broke out with a drug rash several days after taking medication. He discontinued the Lipitor at  that time.      Current Outpatient Prescriptions on File Prior to Visit  Medication Sig Dispense Refill  . aspirin 81 MG tablet Take 81 mg by mouth daily.        Marland Kitchen lisinopril (PRINIVIL,ZESTRIL) 20 MG tablet Take 1 tablet (20 mg total) by mouth daily.  30 tablet  11  . Celecoxib (CELEBREX PO) Take 200 mg by mouth daily.        No current facility-administered medications on file prior to visit.     Allergies  Allergen Reactions  . Lipitor [Atorvastatin] Hives  . Antivert [Meclizine Hcl]   . Iodinated Diagnostic  Agents     Past Medical History  Diagnosis Date  . Coronary artery disease   . Hypertension   . Dyslipidemia   . MI, old     anterior wall    Past Surgical History  Procedure Laterality Date  . Cardiac catheterization  05/11/1995    normal left ventricular systolic function with ef of 70-75%    History  Smoking status  . Never Smoker   Smokeless tobacco  . Not on file    History  Alcohol Use No    No family history on file.  Reviw of Systems:  Reviewed in the HPI.  All other systems are negative.  Physical Exam: BP 162/70  Pulse 60  Ht 5\' 7"  (1.702 m)  Wt 202 lb (91.627 kg)  BMI 31.63 kg/m2 The patient is alert and oriented x 3.  The mood and affect are normal.   Skin: warm and dry.  Color is normal.    HEENT:   Normocephalic/atraumatic. History JVD. His neck is supple.  Lungs: His lung exam is clear.   Heart: Regular rate S1-S2. There is no murmurs.    Abdomen: Good bowel sounds.  Extremities:  No clubbing cyanosis or edema.  Neuro:  Neuro exam is nonfocal. Gait is normal.    ECG: Oct. 22, 2014: NSR at 3, [facility], old septal  MI.    Assessment / Plan:

## 2013-08-16 NOTE — Assessment & Plan Note (Signed)
His blood pressure remains a little elevated. He still eats a fair amount of salt. I've encouraged him not to eat any salt.

## 2013-08-16 NOTE — Assessment & Plan Note (Signed)
Stable.  No angina  

## 2013-08-16 NOTE — Patient Instructions (Addendum)
Nitro was refilled and sent to [address].  Your physician wants you to follow-up in: 6 months  You will receive a reminder letter in the mail two months in advance. If you don't receive a letter, please call our office to schedule the follow-up appointment.   Your physician recommends that you return for a FASTING lipid profile: 6 months   REDUCE HIGH SODIUM FOODS LIKE CANNED SOUP, GRAVY, SAUCES, READY PREPARED FOODS LIKE FROZEN FOODS; LEAN CUISINE, LASAGNA. BACON, SAUSAGE, LUNCH MEAT, FAST FOODS, HOT DOGS, CHIPS, PIZZA, CHINESE FOOD, SOY SAUCE, STORE BOUGHT FRIED CHICKEN= [place] FRIED CHICKEN/ BOJANGLES.   Fat and Cholesterol Control Diet Cholesterol is a wax-like substance. It comes from your liver and is found in certain foods. There is good (HDL) and bad (LDL) cholesterol. Too much cholesterol in your blood can affect your heart. Certain foods can lower or raise your cholesterol. Eat foods that are low in cholesterol. Saturated and trans fats are bad fats found in foods that will raise your cholesterol. Do not eat foods that are high in saturated and trans fats. FOODS HIGHER IN SATURATED AND TRANS FATS  Dairy products, such as whole milk, eggs, cheese, cream, and butter.  Fatty meats, such as hot dogs, sausage, and salami.  Fried foods.  Trans fats which are found in margarine and pre-made cookies, crackers, and baked goods.  Tropical oils, such as coconut and palm oils. Read package labels at the store. Do not buy products that use saturated or trans fats or hydrogenated oils. Find foods labeled:  Low-fat.  Low-saturated fat.  Trans-fat-free.  Low-cholesterol. FOODS LOWER IN CHOLESTEROL   Fruit.  Vegetables.  Beans, peas, and lentils.  Fish.  Lean meat, such as chicken (without skin) or ground [place].  Grains, such as barley, rice, couscous, bulgur wheat, and pasta.  Heart-healthy tub margarine. PREPARING YOUR FOOD  Broil, bake, steam, or roast foods.  Do not fry food.  Use non-stick cooking sprays.  Use lemon or herbs to flavor food instead of using butter or stick margarine.  Use nonfat yogurt, salsa, or low-fat dressings for salads. Document Released: 04/12/2012 Document Reviewed: 04/12/2012 [facility] Patient Information 2014 [place], [place].

## 2013-11-13 ENCOUNTER — Other Ambulatory Visit: Payer: Self-pay

## 2013-11-13 DIAGNOSIS — I10 Essential (primary) hypertension: Secondary | ICD-10-CM

## 2013-11-13 DIAGNOSIS — I2510 Atherosclerotic heart disease of native coronary artery without angina pectoris: Secondary | ICD-10-CM

## 2013-11-13 MED ORDER — LISINOPRIL 20 MG PO TABS: 20.0000 mg | ORAL_TABLET | Freq: Every day | ORAL | Status: DC

## 2014-03-02 ENCOUNTER — Ambulatory Visit: Payer: Medicare Other | Admitting: Cardiovascular Disease

## 2014-04-24 ENCOUNTER — Ambulatory Visit (INDEPENDENT_AMBULATORY_CARE_PROVIDER_SITE_OTHER): Payer: Medicare Other | Admitting: Cardiovascular Disease

## 2014-04-24 ENCOUNTER — Encounter: Payer: Self-pay | Admitting: Cardiovascular Disease

## 2014-04-24 VITALS — BP 145/80 | HR 60 | Ht 67.0 in | Wt 199.8 lb

## 2014-04-24 DIAGNOSIS — I2510 Atherosclerotic heart disease of native coronary artery without angina pectoris: Secondary | ICD-10-CM

## 2014-04-24 DIAGNOSIS — I10 Essential (primary) hypertension: Secondary | ICD-10-CM

## 2014-04-24 DIAGNOSIS — E785 Hyperlipidemia, unspecified: Secondary | ICD-10-CM

## 2014-04-24 DIAGNOSIS — I2583 Coronary atherosclerosis due to lipid rich plaque: Principal | ICD-10-CM

## 2014-04-24 LAB — BASIC METABOLIC PANEL
BUN: 9 mg/dL (ref 6–23)
CHLORIDE: 101 meq/L (ref 96–112)
CO2: 29 mEq/L (ref 19–32)
Calcium: 9 mg/dL (ref 8.4–10.5)
Creatinine, Ser: 1 mg/dL (ref 0.4–1.5)
GFR: 76.35 mL/min (ref >60.00)
Glucose, Bld: 93 mg/dL (ref 70–99)
POTASSIUM: 4.3 meq/L (ref 3.5–5.1)
SODIUM: 137 meq/L (ref 135–145)

## 2014-04-24 LAB — HEPATIC FUNCTION PANEL
ALBUMIN: 4.1 g/dL (ref 3.5–5.2)
ALT: 33 U/L (ref 0–53)
AST: 34 U/L (ref 0–37)
Alkaline Phosphatase: 49 U/L (ref 39–117)
Bilirubin, Direct: 0.2 mg/dL (ref 0.0–0.3)
Total Bilirubin: 0.9 mg/dL (ref 0.2–1.2)
Total Protein: 7 g/dL (ref 6.0–8.3)

## 2014-04-24 LAB — LIPID PANEL
CHOLESTEROL: 186 mg/dL (ref 0–200)
HDL: 32.8 mg/dL — AB (ref >39.00)
LDL Cholesterol: 129 mg/dL — ABNORMAL HIGH (ref 0–99)
NonHDL: 153.2
Total CHOL/HDL Ratio: 6
Triglycerides: 121 mg/dL (ref 0.0–149.0)
VLDL: 24.2 mg/dL (ref 0.0–40.0)

## 2014-04-24 NOTE — Assessment & Plan Note (Signed)
Clinton HuaDavid is doing well. He's not had any episodes of chest pain. Continue the same medications. His lipids have been well controlled.

## 2014-04-24 NOTE — Progress Notes (Signed)
Clinton Garza Date of Birth  December 19, 1935 [facility] [address]    [address] [place], [place]  [address] [phone]  Fax  [phone]  Problem list: 1. Coronary artery disease-status post anterior wall myocardial infarction in 1996. He status post PTCA of the LAD 2. Hyperlipidemia   History of Present Illness:  Clinton Garza is a 78 year old gentleman with a history of coronary artery disease. He status post anterior wall myocardial infarction in 1996. We performed PTCA of his left anterior descending artery.  A stress Myoview study performed in 2000 it reveals anteroapical scar but no evidence of ischemia. His left ventricular systolic function was normal at 56%. An echocardiogram performed in May of 2001 reveals an ejection fraction between 55 and 60%. He has hypokinesis and calcification of the apex at the site of his old apical myocardial infarction.  He has gained some weight.  No chest pain.   Oct. 3, 2013 - He has been busy - he bought a [place] at [facility] [place] and has been remodeling the house.  He has not been getting as much exercise as he would like.  Oct. 22, 2014    Clinton Garza is doing well.  Still working on [place] [facility] [place] [place].    He still eats some salt.   He denies any angina.   He was placed on atorvastatin 80 mg a day. Unfortunately, he broke out with a drug rash several days after taking medication. He discontinued the Lipitor at  that time.    April 24, 2014: Clinton Garza is doing well.  No CP.  Still eating some salt.  Does have some DOe if he walks too fast.     Current Outpatient Prescriptions on File Prior to Visit  Medication Sig Dispense Refill  . aspirin 81 MG tablet Take 81 mg by mouth daily.        Marland Kitchen. lisinopril (PRINIVIL,ZESTRIL) 20 MG tablet Take 1 tablet (20 mg total) by mouth daily.  30 tablet  6  . nitroGLYCERIN (NITROSTAT) 0.4 MG SL tablet Place 1 tablet (0.4 mg total) under the tongue every 5 (five) minutes as needed.  25 tablet  3    No current facility-administered medications on file prior to visit.     Allergies  Allergen Reactions  . Lipitor [Atorvastatin] Hives  . Antivert [Meclizine Hcl]   . Iodinated Diagnostic Agents     Past Medical History  Diagnosis Date  . Coronary artery disease   . Hypertension   . Dyslipidemia   . MI, old     anterior wall    Past Surgical History  Procedure Laterality Date  . Cardiac catheterization  05/11/1995    normal left ventricular systolic function with ef of 70-75%    History  Smoking status  . Never Smoker   Smokeless tobacco  . Not on file    History  Alcohol Use No    No family history on file.  Reviw of Systems:  Reviewed in the HPI.  All other systems are negative.  Physical Exam: BP 145/80  Pulse 60  Ht 5\' 7"  (1.702 m)  Wt 199 lb 12.8 oz (90.629 kg)  BMI 31.29 kg/m2 The patient is alert and oriented x 3.  The mood and affect are normal.   Skin: warm and dry.  Color is normal.    HEENT:   Normocephalic/atraumatic. History JVD. His neck is supple.  Lungs: His lung exam is clear.   Heart: Regular rate S1-S2. There  is no murmurs.    Abdomen: Good bowel sounds.  Extremities:  No clubbing cyanosis or edema.  Neuro:  Neuro exam is nonfocal. Gait is normal.    ECG: Oct. 22, 2014[facility].    Assessment / Plan:

## 2014-04-24 NOTE — Patient Instructions (Signed)
Your physician recommends that you continue on your current medications as directed. Please refer to the Current Medication list given to you today.  Your physician recommends that you have lab work:  TODAY - cholesterol, liver panel, basic metabolic panel  Your physician wants you to follow-up in: 6 months with Dr. Elease HashimotoNahser.  You will receive a reminder letter in the mail two months in advance. If you don't receive a letter, please call our office to schedule the follow-up appointment.

## 2014-04-24 NOTE — Assessment & Plan Note (Signed)
He ran out of his Lisinopril 2 days ago.  His BP was better after several minutes of sitting quietly.   Restart Lisinopril.  I'll see him in 6 months.

## 2014-04-24 NOTE — Assessment & Plan Note (Signed)
Lipids are well controlled.  Recheck labs today.

## 2014-09-04 ENCOUNTER — Other Ambulatory Visit: Payer: Self-pay

## 2014-09-04 DIAGNOSIS — I159 Secondary hypertension, unspecified: Secondary | ICD-10-CM

## 2014-09-04 MED ORDER — LISINOPRIL 20 MG PO TABS: 20.0000 mg | ORAL_TABLET | Freq: Every day | ORAL | Status: DC

## 2014-09-04 NOTE — Telephone Encounter (Signed)
Refill sent for lisinopril  

## 2014-11-15 ENCOUNTER — Encounter: Payer: Self-pay | Admitting: Cardiovascular Disease

## 2014-11-15 ENCOUNTER — Ambulatory Visit (INDEPENDENT_AMBULATORY_CARE_PROVIDER_SITE_OTHER): Payer: Medicare Other | Admitting: Cardiovascular Disease

## 2014-11-15 VITALS — BP 178/92 | HR 61 | Ht 67.0 in | Wt 210.4 lb

## 2014-11-15 DIAGNOSIS — I252 Old myocardial infarction: Secondary | ICD-10-CM

## 2014-11-15 DIAGNOSIS — I2510 Atherosclerotic heart disease of native coronary artery without angina pectoris: Secondary | ICD-10-CM

## 2014-11-15 DIAGNOSIS — I10 Essential (primary) hypertension: Secondary | ICD-10-CM

## 2014-11-15 DIAGNOSIS — E785 Hyperlipidemia, unspecified: Secondary | ICD-10-CM

## 2014-11-15 MED ORDER — HYDROCHLOROTHIAZIDE 25 MG PO TABS: 25.0000 mg | ORAL_TABLET | Freq: Every day | ORAL | Status: DC

## 2014-11-15 MED ORDER — POTASSIUM CHLORIDE ER 10 MEQ PO TBCR: 10.0000 meq | EXTENDED_RELEASE_TABLET | Freq: Every day | ORAL | Status: DC

## 2014-11-15 NOTE — Patient Instructions (Signed)
Your physician has recommended you make the following change in your medication:  START HCTZ (Hydrochlorothiazide) 25 mg once daily START Kdur (potassium supplement) 10 meq once daily  Your physician recommends that you return for lab work in: 1 month for basic metabolic panel  Your physician wants you to follow-up in: 6 months with Dr. Elease HashimotoNahser.  You will receive a reminder letter in the mail two months in advance. If you don't receive a letter, please call our office to schedule the follow-up appointment. Your physician recommends that you return for lab work in: 6 months on the day of or a few days before your office visit with Dr. Elease HashimotoNahser.  You will need to FAST for this appointment - nothing to eat or drink after midnight the night before except water.  Your physician has requested that you regularly monitor and record your blood pressure readings at home. Please use the same machine at the same time of day to check your readings and record them to bring to your follow-up visit.

## 2014-11-15 NOTE — Progress Notes (Signed)
Cardiology Office No   Date:  11/15/2014   ID:  Clinton Garza, DOB [birth year], MRN [id number]  PCP:  Mickie Hillier, MD  Cardiologist:   Vesta Mixer, MD   Chief Complaint  Patient presents with  . Follow-up    CAD      History of Present Illness: Clinton Garza is a 79 y.o. male who presents for   1. Coronary artery disease-status post anterior wall myocardial infarction in 1996. He status post PTCA of the LAD 2. Hyperlipidemia   History of Present Illness:  Clinton Garza is a 79 year old gentleman with a history of coronary artery disease. He status post anterior wall myocardial infarction in 1996. We performed PTCA of his left anterior descending artery. A stress Myoview study performed in 2000 it reveals anteroapical scar but no evidence of ischemia. His left ventricular systolic function was normal at 56%. An echocardiogram performed in May of 2001 reveals an ejection fraction between 55 and 60%. He has hypokinesis and calcification of the apex at the site of his old apical myocardial infarction.  He has gained some weight. No chest pain.   Oct. 3, 2013 - He has been busy - he bought a house at [facility] and has been remodeling the house. He has not been getting as much exercise as he would like.  Oct. 22, 2014   Clinton Garza is doing well. Still working on his [employer] [place] house.   He still eats some salt. He denies any angina.   He was placed on atorvastatin 80 mg a day. Unfortunately, he broke out with a drug rash several days after taking medication. He discontinued the Lipitor at that time.   April 24, 2014: Clinton Garza is doing well. No CP. Still eating some salt. Does have some DOe if he walks too fast.   Jan. 21, 2016:    Clinton Garza is doing well.  Has gained some weight - spine issues.  Still eats a little extra salt - Does not check his BP at home.    Past Medical History  Diagnosis Date  . Coronary artery disease   . Hypertension   .  Dyslipidemia   . MI, old     anterior wall    Past Surgical History  Procedure Laterality Date  . Cardiac catheterization  05/11/1995    normal left ventricular systolic function with ef of 70-75%  . Eye surgery       Current Outpatient Prescriptions  Medication Sig Dispense Refill  . aspirin 81 MG tablet Take 81 mg by mouth daily.      Marland Kitchen lisinopril (PRINIVIL,ZESTRIL) 20 MG tablet Take 1 tablet (20 mg total) by mouth daily. 30 tablet 6  . nitroGLYCERIN (NITROSTAT) 0.4 MG SL tablet Place 1 tablet (0.4 mg total) under the tongue every 5 (five) minutes as needed. 25 tablet 3   No current facility-administered medications for this visit.    Allergies:   Lipitor; Antivert; and Iodinated diagnostic agents    Social History:  The patient  reports that he has never smoked. He does not have any smokeless tobacco history on file. He reports that he does not drink alcohol or use illicit drugs.   Family History:  The patient's family history includes Cancer in his mother; Heart Problems in his father.    ROS:  Please see the history of present illness.   Otherwise, review of systems are positive for none.   All other systems are reviewed and negative.  PHYSICAL EXAM: VS:  BP 178/92 mmHg  Pulse 61  Ht 5\' 7"  (1.702 m)  Wt 210 lb 6.4 oz (95.437 kg)  BMI 32.95 kg/m2 , BMI Body mass index is 32.95 kg/(m^2). GEN: Well nourished, well developed, in no acute distress HEENT: normal Neck: no JVD, carotid bruits, or masses Cardiac: RRR; no murmurs, rubs, or gallops,no edema  Respiratory:  clear to auscultation bilaterally, normal work of breathing GI: soft, nontender, nondistended, + BS MS: no deformity or atrophy Skin: warm and dry, no rash Neuro:  Strength and sensation are intact Psych: euthymic mood, full affect   EKG:  EKG is ordered today. The ekg ordered today demonstrates normal sinus rhythm at 61. Has an old anteroseptal myocardial infarction.   Recent Labs: 04/24/2014: ALT  33; BUN 9; Creatinine 1.0; Potassium 4.3; Sodium 137    Lipid Panel    Component Value Date/Time   CHOL 186 04/24/2014 1246   TRIG 121.0 04/24/2014 1246   HDL 32.80* 04/24/2014 1246   CHOLHDL 6 04/24/2014 1246   VLDL 24.2 04/24/2014 1246   LDLCALC 129* 04/24/2014 1246      Wt Readings from Last 3 Encounters:  11/15/14 210 lb 6.4 oz (95.437 kg)  04/24/14 199 lb 12.8 oz (90.629 kg)  08/16/13 202 lb (91.627 kg)      Other studies Reviewed: Additional studies/ records that were reviewed today include: . Review of the above records demonstrates:    ASSESSMENT AND PLAN:  1.  1. Coronary artery disease: The patient has a history of antral wall myocardial infarction past. He's not had any episodes of chest pain. Continue same medications.  2. Hypertension: The patient is currently on lisinopril. We will add HCTZ 25 mg a day and potassium chloride 10 mg today. We will check a basic metabolic profile in one month. I'll see him in 6 month for follow-up visit. I have advised him to check his blood pressure on a regular basis and to bring in his blood pressure log at his next visit.  3. Hyperlipidemia: We will check fasting lipids at his next visit.  He is intolerant to atorvastatin.     Current medicines are reviewed at length with the patient today.  The patient does not have concerns regarding medicines.  The following changes have been made:  Add HCTZ 25 a day, Kdur 10 meq a day   Labs/ tests ordered today include:  No orders of the defined types were placed in this encounter.     Disposition:   FU with me  in 6 months   Signed, Nahser, Deloris PingPhilip J, MD  11/15/2014 3:09 PM    [facility] [address], [place], [place]  [address] Phone: [phone]; Fax: [phone]

## 2014-12-14 ENCOUNTER — Other Ambulatory Visit (INDEPENDENT_AMBULATORY_CARE_PROVIDER_SITE_OTHER): Payer: Medicare Other | Admitting: *Deleted

## 2014-12-14 DIAGNOSIS — I10 Essential (primary) hypertension: Secondary | ICD-10-CM

## 2014-12-14 DIAGNOSIS — E785 Hyperlipidemia, unspecified: Secondary | ICD-10-CM

## 2014-12-14 DIAGNOSIS — I2510 Atherosclerotic heart disease of native coronary artery without angina pectoris: Secondary | ICD-10-CM

## 2014-12-14 LAB — BASIC METABOLIC PANEL
BUN: 23 mg/dL (ref 6–23)
CHLORIDE: 102 meq/L (ref 96–112)
CO2: 32 meq/L (ref 19–32)
Calcium: 9.6 mg/dL (ref 8.4–10.5)
Creatinine, Ser: 1.36 mg/dL (ref 0.40–1.50)
GFR: 54.07 mL/min — AB (ref >60.00)
GLUCOSE: 108 mg/dL — AB (ref 70–99)
POTASSIUM: 4 meq/L (ref 3.5–5.1)
Sodium: 139 mEq/L (ref 135–145)

## 2014-12-17 ENCOUNTER — Other Ambulatory Visit: Payer: Medicare Other

## 2015-01-08 ENCOUNTER — Telehealth: Payer: Self-pay | Admitting: Cardiovascular Disease

## 2015-01-08 NOTE — Telephone Encounter (Signed)
Reviewed lab results with patient from 2/19.  Patient states he changed computers and did not get any notification from lab results sent through MyChart. Patient verbalized understanding of results and to continue current treatment plan.

## 2015-01-08 NOTE — Telephone Encounter (Signed)
New message        Pt calling for lab results from 12/14/2014

## 2015-04-22 ENCOUNTER — Other Ambulatory Visit: Payer: Self-pay

## 2015-07-08 ENCOUNTER — Other Ambulatory Visit: Payer: Self-pay

## 2015-07-08 DIAGNOSIS — I159 Secondary hypertension, unspecified: Secondary | ICD-10-CM

## 2015-07-08 MED ORDER — LISINOPRIL 20 MG PO TABS: 20.0000 mg | ORAL_TABLET | Freq: Every day | ORAL | Status: DC

## 2015-07-08 NOTE — Telephone Encounter (Signed)
Medicine sent to the pharmacy

## 2015-08-11 ENCOUNTER — Other Ambulatory Visit: Payer: Self-pay | Admitting: Cardiovascular Disease

## 2015-09-16 ENCOUNTER — Other Ambulatory Visit: Payer: Self-pay | Admitting: Cardiovascular Disease

## 2015-09-25 ENCOUNTER — Other Ambulatory Visit: Payer: Self-pay | Admitting: Cardiovascular Disease

## 2015-10-11 ENCOUNTER — Emergency Department (HOSPITAL_COMMUNITY): Payer: Medicare Other

## 2015-10-11 ENCOUNTER — Encounter (HOSPITAL_COMMUNITY): Payer: Self-pay | Admitting: *Deleted

## 2015-10-11 ENCOUNTER — Emergency Department (HOSPITAL_COMMUNITY)
Admission: EM | Admit: 2015-10-11 | Discharge: 2015-10-11 | Disposition: A | Discharge status: Home / Self Care | Payer: Medicare Other | Attending: Emergency Medicine | Admitting: Emergency Medicine

## 2015-10-11 DIAGNOSIS — I252 Old myocardial infarction: Secondary | ICD-10-CM | POA: Diagnosis not present

## 2015-10-11 DIAGNOSIS — Z79899 Other long term (current) drug therapy: Secondary | ICD-10-CM | POA: Insufficient documentation

## 2015-10-11 DIAGNOSIS — I2510 Atherosclerotic heart disease of native coronary artery without angina pectoris: Secondary | ICD-10-CM | POA: Insufficient documentation

## 2015-10-11 DIAGNOSIS — Z9889 Other specified postprocedural states: Secondary | ICD-10-CM | POA: Diagnosis not present

## 2015-10-11 DIAGNOSIS — Z8639 Personal history of other endocrine, nutritional and metabolic disease: Secondary | ICD-10-CM | POA: Insufficient documentation

## 2015-10-11 DIAGNOSIS — R42 Dizziness and giddiness: Secondary | ICD-10-CM | POA: Diagnosis present

## 2015-10-11 DIAGNOSIS — Z7982 Long term (current) use of aspirin: Secondary | ICD-10-CM | POA: Insufficient documentation

## 2015-10-11 DIAGNOSIS — I10 Essential (primary) hypertension: Secondary | ICD-10-CM | POA: Insufficient documentation

## 2015-10-11 DIAGNOSIS — E119 Type 2 diabetes mellitus without complications: Secondary | ICD-10-CM | POA: Insufficient documentation

## 2015-10-11 LAB — CBC
HCT: 49.7 % (ref 39.0–52.0)
HEMOGLOBIN: 16.2 g/dL (ref 13.0–17.0)
MCH: 30.9 pg (ref 26.0–34.0)
MCHC: 32.6 g/dL (ref 30.0–36.0)
MCV: 94.7 fL (ref 78.0–100.0)
Platelets: 98 10*3/uL — ABNORMAL LOW (ref 150–400)
RBC: 5.25 MIL/uL (ref 4.22–5.81)
RDW: 13.6 % (ref 11.5–15.5)
WBC: 6.7 10*3/uL (ref 4.0–10.5)

## 2015-10-11 LAB — URINALYSIS, ROUTINE W REFLEX MICROSCOPIC
BILIRUBIN URINE: NEGATIVE
Glucose, UA: NEGATIVE mg/dL
Hgb urine dipstick: NEGATIVE
KETONES UR: NEGATIVE mg/dL
LEUKOCYTES UA: NEGATIVE
NITRITE: NEGATIVE
Protein, ur: NEGATIVE mg/dL
SPECIFIC GRAVITY, URINE: 1.019 (ref 1.005–1.030)
pH: 5.5 (ref 5.0–8.0)

## 2015-10-11 LAB — BASIC METABOLIC PANEL
ANION GAP: 9 (ref 5–15)
BUN: 16 mg/dL (ref 6–20)
CALCIUM: 9.7 mg/dL (ref 8.9–10.3)
CO2: 26 mmol/L (ref 22–32)
Chloride: 106 mmol/L (ref 101–111)
Creatinine, Ser: 1.13 mg/dL (ref 0.61–1.24)
GLUCOSE: 103 mg/dL — AB (ref 65–99)
POTASSIUM: 4.2 mmol/L (ref 3.5–5.1)
SODIUM: 141 mmol/L (ref 135–145)

## 2015-10-11 LAB — I-STAT TROPONIN, ED: TROPONIN I, POC: 0 ng/mL (ref 0.00–0.08)

## 2015-10-11 MED ORDER — MECLIZINE HCL 50 MG PO TABS: 25.0000 mg | ORAL_TABLET | Freq: Three times a day (TID) | ORAL | Status: DC | PRN

## 2015-10-11 MED ORDER — LORAZEPAM 1 MG PO TABS: 1.0000 mg | ORAL_TABLET | Freq: Three times a day (TID) | ORAL | Status: DC | PRN

## 2015-10-11 MED ORDER — HYDRALAZINE HCL 20 MG/ML IJ SOLN
10.0000 mg | Freq: Once | INTRAMUSCULAR | Status: AC
  Administered 2015-10-11: 10 mg via INTRAVENOUS
  Filled 2015-10-11: qty 1

## 2015-10-11 MED ORDER — MECLIZINE HCL 25 MG PO TABS
25.0000 mg | ORAL_TABLET | Freq: Once | ORAL | Status: AC
  Administered 2015-10-11: 25 mg via ORAL
  Filled 2015-10-11: qty 1

## 2015-10-11 NOTE — ED Provider Notes (Signed)
CSN: [id number]     Arrival date & time 10/11/15  1827 History   First MD Initiated Contact with Patient 10/11/15 1917     Chief Complaint  Patient presents with  . Dizziness   79 year old Caucasian male with PMH of CAD, HTN, DM LD who presents today with 1 week's worth of episodes of dizziness. Patient's has come on suddenly and do not seem to be associated with quickly standing or sitting down. Not associated with nausea vomiting fevers chills chest pain shortness of breath flank pain and hematuria dysuria sick contacts recent fall. Dizzy spells last only a few seconds. He has to grab onto something to prevent himself from falling. He has not fallen disease episodes. However had anything like this happen in the past.  Patient is a 79 y.o. male presenting with dizziness.  Dizziness Quality:  Room spinning Severity:  Moderate Onset quality:  Sudden Duration:  1 week Timing:  Intermittent Progression:  Unchanged Chronicity:  New Context: not with ear pain and not when standing up   Associated symptoms: no chest pain, no diarrhea, no headaches, no nausea and no vomiting     Past Medical History  Diagnosis Date  . Coronary artery disease   . Hypertension   . Dyslipidemia   . MI, old     anterior wall   Past Surgical History  Procedure Laterality Date  . Cardiac catheterization  05/11/1995    normal left ventricular systolic function with ef of 70-75%  . Eye surgery     Family History  Problem Relation Age of Onset  . Cancer Mother   . Heart Problems Father    Social History  Substance Use Topics  . Smoking status: Never Smoker   . Smokeless tobacco: None  . Alcohol Use: No    Review of Systems  Constitutional: Negative for fever, chills, diaphoresis and fatigue.  HENT: Negative for congestion.   Eyes: Negative for photophobia and visual disturbance.  Respiratory: Negative for chest tightness.   Cardiovascular: Negative for chest pain.  Gastrointestinal: Negative for  nausea, vomiting and diarrhea.  Genitourinary: Negative for dysuria.  Skin: Negative for wound.  Neurological: Positive for dizziness (room spinning). Negative for headaches.  Psychiatric/Behavioral: Negative for hallucinations.  All other systems reviewed and are negative.     Allergies  Lipitor; Antivert; and Iodinated diagnostic agents  Home Medications   Prior to Admission medications   Medication Sig Start Date End Date Taking? Authorizing Provider  aspirin 81 MG tablet Take 81 mg by mouth daily.     Yes Historical Provider, MD  lisinopril (PRINIVIL,ZESTRIL) 20 MG tablet Take 1 tablet (20 mg total) by mouth daily. 09/25/15  Yes Vesta Mixer, MD  nitroGLYCERIN (NITROSTAT) 0.4 MG SL tablet Place 1 tablet (0.4 mg total) under the tongue every 5 (five) minutes as needed. 08/16/13  Yes Vesta Mixer, MD  hydrochlorothiazide (HYDRODIURIL) 25 MG tablet Take 1 tablet (25 mg total) by mouth daily. Patient not taking: Reported on 10/11/2015 11/15/14   Vesta Mixer, MD  LORazepam (ATIVAN) 1 MG tablet Take 1 tablet (1 mg total) by mouth every 8 (eight) hours as needed (breakthrough vertigo). 10/11/15   Rachelle Hora, MD  meclizine (ANTIVERT) 50 MG tablet Take 0.5 tablets (25 mg total) by mouth 3 (three) times daily as needed for dizziness. 10/11/15   Rachelle Hora, MD  potassium chloride (K-DUR) 10 MEQ tablet Take 1 tablet (10 mEq total) by mouth daily. Patient not taking: Reported on 10/11/2015 11/15/14  Vesta MixerPhilip J Nahser, MD   BP 176/81 mmHg  Pulse 71  Temp(Src) 97.7 F (36.5 C) (Oral)  Resp 23  SpO2 97% Physical Exam  Constitutional: He is oriented to person, place, and time. He appears well-developed and well-nourished. No distress.  HENT:  Head: Normocephalic and atraumatic.  Eyes: Pupils are equal, round, and reactive to light.  Neck: Normal range of motion.  Cardiovascular: Normal rate and normal heart sounds.  Exam reveals no gallop and no friction rub.   No murmur  heard. Pulmonary/Chest: Effort normal and breath sounds normal. No respiratory distress. He has no wheezes. He has no rales. He exhibits no tenderness.  Abdominal: Soft. Bowel sounds are normal. He exhibits no distension. There is no tenderness.  Musculoskeletal: Normal range of motion.  Neurological: He is alert and oriented to person, place, and time.  Skin: Skin is dry. He is not diaphoretic.    ED Course  Procedures (including critical care time) Labs Review Labs Reviewed  BASIC METABOLIC PANEL - Abnormal; Notable for the following:    Glucose, Bld 103 (*)    All other components within normal limits  CBC - Abnormal; Notable for the following:    Platelets 98 (*)    All other components within normal limits  URINALYSIS, ROUTINE W REFLEX MICROSCOPIC (NOT AT [facility])  Rosezena SensorI-STAT TROPOININ, ED    Imaging Review Dg Chest 2 View  10/11/2015  CLINICAL DATA:  Nausea and vomiting EXAM: CHEST - 2 VIEW COMPARISON:  12/18/2005 FINDINGS: The heart size and mediastinal contours are within normal limits. Both lungs are clear. The visualized skeletal structures are unremarkable. IMPRESSION: No active disease. Electronically Signed   By: Alcide CleverMark  Lukens M.D.   On: 10/11/2015 20:15   I have personally reviewed and evaluated these images and lab results as part of my medical decision-making.   EKG Interpretation   Date/Time:  Friday October 11 2015 18:38:56 EST Ventricular Rate:  56 PR Interval:  180 QRS Duration: 94 QT Interval:  428 QTC Calculation: 413 R Axis:   -55 Text Interpretation:  Sinus bradycardia Left anterior fascicular block  Anterior infarct , age undetermined Abnormal ECG Sinus rhythm Left  anterior fasicular block Left axis deviation Abnormal ekg Confirmed by  Gerhard MunchLOCKWOOD, ROBERT  MD [phone]([id number]) on 10/11/2015 9:04:19 PM      MDM   Final diagnoses:  Vertigo    [age] year old male here with dizziness/room spinning sensation. Also questions hypertension is causing this. See history of  present illness.  On exam NAD, AF VSS assessment hypertension with systolics in the 190s. No focal deficits on exam no nystagmus seen. TMs clear. Remainder of exam benign.  Will check basic labs EKG chest x-ray, though doubt these spells are due to his hypertension.  Labs and normal limits: Noncontributory cardiac in nature. Not consistent with hypertensive urgency or emergency. Likely this is vertigo. We'll treat with meclizine and Ativan for breakthrough. Follow-up to PCP on Monday for medication adjustments for his hypertension. Strict return with worsening symptoms.  Pt was seen under the supervision of Dr. Jeraldine LootsLockwood.    Rachelle HoraKeri Suha Schoenbeck, MD 10/12/15 [id number]  Rachelle HoraKeri Veva Grimley, MD 10/12/15 [id number]  Gerhard Munchobert Lockwood, MD 10/12/15 [id number]

## 2015-10-11 NOTE — ED Notes (Signed)
Pt reports intermittent dizziness for several days. Pt states that he has had high BP as well. Pt states the he was seen yesterday at the walk in clinic and was given a pill to bring his BP down.

## 2015-10-11 NOTE — ED Notes (Signed)
Pt stable, ambulatory, states understanding of discharge instructions 

## 2015-10-11 NOTE — ED Notes (Signed)
Dr Jeraldine LootsLockwood advised pt that he can eat

## 2015-10-11 NOTE — Discharge Instructions (Signed)

## 2015-10-14 ENCOUNTER — Telehealth: Payer: Self-pay | Admitting: Cardiovascular Disease

## 2015-10-14 MED ORDER — HYDROCHLOROTHIAZIDE 25 MG PO TABS: 25.0000 mg | ORAL_TABLET | Freq: Every day | ORAL | Status: DC

## 2015-10-14 MED ORDER — POTASSIUM CHLORIDE ER 10 MEQ PO TBCR: 10.0000 meq | EXTENDED_RELEASE_TABLET | Freq: Every day | ORAL | Status: DC

## 2015-10-14 NOTE — Telephone Encounter (Signed)
Spoke with patient who states he had n/v/d and dizziness beginning Wednesday 12/14.  He states he did not feel any better on Friday even though the vomiting and diarrhea resolved so [facility].  He was advised to start Meclizine and Ativan for dizziness and to call our office regarding high blood pressure.  He states he cannot take meclizine but that he is not dizzy at this time.  He reports that he takes Lisinopril at night, BP today 170's systolic over 100's diastolic.  He stopped his HCTZ and Kdur about 7-8 months ago because he did not think it was helping him.  I advised him to restart HCTZ 25 mg and Kdur 10 meq once daily as Dr. Elease HashimotoNahser has not advised him to stop.  He asked for new Rx to be sent to pharmacy.  I have reordered and advised him to monitor BP and call me if he does not see improvement.  He has 1 year follow-up with Dr. Elease HashimotoNahser scheduled for 1/23.  I advised him to call sooner if BP does not improve.  He verbalized understanding and agreement.

## 2015-10-14 NOTE — Telephone Encounter (Signed)
Spoke with patient and wife to ask if he needs any medication for nausea.  Patient denies.  Wife got on the phone and asked if they should be concerned about low platelet count (98,000); states has not ever been low to their knowledge.  I do not have a past CBC for comparison.  She is concerned about this in addition to quick onset of n/v/d and dizziness last week in addition to hypertension.  She states they have not monitored BP at home so she does not know how long BP has been elevated prior to Friday.  I advised her that I will have Dr. Elease HashimotoNahser review all of this information and will call her back with his advice.  Patient is currently scheduled to see Dr. Elease HashimotoNahser on 1/23.  Patient and wife verbalized understanding and agreement.

## 2015-10-14 NOTE — Telephone Encounter (Signed)
Pt c/o BP issue: STAT if pt c/o blurred vision, one-sided weakness or slurred speech  1. What are your last 5 BP readings? 175/103 p 61 this am (12/19)  2. Are you having any other symptoms (ex. Dizziness, headache, blurred vision, passed out)? Dizzy spells  3. What is your BP issue? Seen @ [facility] for high BP- requested to speak w/ RN- no f/u on AVS. Please call back and discuss.

## 2015-10-14 NOTE — Telephone Encounter (Signed)
Agree with note from Eligha BridegroomMichelle Swinyer, RN.  We can also add Zofran as needed for vomitting to help him keep his meds down

## 2015-10-15 NOTE — Telephone Encounter (Signed)
Lets allow him to recovery from his illness, Restart his meds and then see how his BP does

## 2015-10-15 NOTE — Telephone Encounter (Signed)
Spoke with patient and reviewed Dr. Harvie BridgeNahser's advice.  I advised him to monitor symptoms and blood pressure and to call the office if he feels that he needs to be seen prior to 1/23.  He verbalized understanding and agreement and thanked me for the call.

## 2015-10-22 ENCOUNTER — Other Ambulatory Visit: Payer: Self-pay | Admitting: Cardiovascular Disease

## 2015-11-18 ENCOUNTER — Ambulatory Visit (INDEPENDENT_AMBULATORY_CARE_PROVIDER_SITE_OTHER): Payer: Medicare Other | Admitting: Cardiovascular Disease

## 2015-11-18 ENCOUNTER — Encounter: Payer: Self-pay | Admitting: Cardiovascular Disease

## 2015-11-18 VITALS — BP 132/58 | HR 57 | Ht 67.0 in | Wt 196.8 lb

## 2015-11-18 DIAGNOSIS — Z79899 Other long term (current) drug therapy: Secondary | ICD-10-CM | POA: Diagnosis not present

## 2015-11-18 DIAGNOSIS — I2510 Atherosclerotic heart disease of native coronary artery without angina pectoris: Secondary | ICD-10-CM

## 2015-11-18 DIAGNOSIS — E785 Hyperlipidemia, unspecified: Secondary | ICD-10-CM | POA: Diagnosis not present

## 2015-11-18 DIAGNOSIS — I10 Essential (primary) hypertension: Secondary | ICD-10-CM

## 2015-11-18 LAB — HEPATIC FUNCTION PANEL
ALT: 24 U/L (ref 9–46)
AST: 25 U/L (ref 10–35)
Albumin: 4.1 g/dL (ref 3.6–5.1)
Alkaline Phosphatase: 43 U/L (ref 40–115)
BILIRUBIN DIRECT: 0.3 mg/dL — AB (ref <=0.2)
Indirect Bilirubin: 0.8 mg/dL (ref 0.2–1.2)
Total Bilirubin: 1.1 mg/dL (ref 0.2–1.2)
Total Protein: 6.4 g/dL (ref 6.1–8.1)

## 2015-11-18 LAB — LIPID PANEL
CHOLESTEROL: 135 mg/dL (ref 125–200)
HDL: 32 mg/dL — ABNORMAL LOW (ref >=40)
LDL Cholesterol: 86 mg/dL (ref <130)
Total CHOL/HDL Ratio: 4.2 Ratio (ref <=5.0)
Triglycerides: 84 mg/dL (ref <150)
VLDL: 17 mg/dL (ref <30)

## 2015-11-18 LAB — BASIC METABOLIC PANEL
BUN: 21 mg/dL (ref 7–25)
CHLORIDE: 100 mmol/L (ref 98–110)
CO2: 27 mmol/L (ref 20–31)
CREATININE: 1.27 mg/dL — AB (ref 0.70–1.18)
Calcium: 9.6 mg/dL (ref 8.6–10.3)
Glucose, Bld: 109 mg/dL — ABNORMAL HIGH (ref 65–99)
Potassium: 4.3 mmol/L (ref 3.5–5.3)
Sodium: 140 mmol/L (ref 135–146)

## 2015-11-18 LAB — TSH: TSH: 4.374 u[IU]/mL (ref 0.350–4.500)

## 2015-11-18 NOTE — Patient Instructions (Addendum)
Medication Instructions:  The current medical regimen is effective;  continue present plan and medications.  Labwork: Please have blood work today (TSH, BMP, LFT and Lipid panel)  Follow-Up: Follow up in 6 months with Dr. Elease Hashimoto.  You will receive a letter in the mail 2 months before you are due.  Please call us when you receive this letter to schedule your follow up appointment.   If you need a refill on your cardiac medications before your next appointment, please call your pharmacy.  Thank you for choosing [facility]!!

## 2015-11-18 NOTE — Progress Notes (Signed)
Cardiology Office No   Date:  11/18/2015   ID:  Oluwatimilehin Balfour, DOB 08-12-1936, MRN [id number]  PCP:  Mickie Hillier, MD  Cardiologist:   Vesta Mixer, MD   Chief Complaint  Patient presents with  . Follow-up    cad      History of Present Illness: Clinton Garza is a 80 y.o. male who presents for   1. Coronary artery disease-status post anterior wall myocardial infarction in 1996. He status post PTCA of the LAD 2. Hyperlipidemia   History of Present Illness:  Vito is a 80 year old gentleman with a history of coronary artery disease. He status post anterior wall myocardial infarction in 1996. We performed PTCA of his left anterior descending artery. A stress Myoview study performed in 2000 it reveals anteroapical scar but no evidence of ischemia. His left ventricular systolic function was normal at 56%. An echocardiogram performed in May of 2001 reveals an ejection fraction between 55 and 60%. He has hypokinesis and calcification of the apex at the site of his old apical myocardial infarction.  He has gained some weight. No chest pain.   Oct. 3, 2013 - He has been busy - he bought a house at [facility] and has been remodeling the house. He has not been getting as much exercise as he would like.  Oct. 22, 2014   Gautam is doing well. Still working on his [employer].   He still eats some salt. He denies any angina.   He was placed on atorvastatin 80 mg a day. Unfortunately, he broke out with a drug rash several days after taking medication. He discontinued the Lipitor at that time.   April 24, 2014: Ellias is doing well. No CP. Still eating some salt. Does have some DOe if he walks too fast.   Jan. 21, 2016:    Cornelious is doing well.  Has gained some weight - spine issues.  Still eats a little extra salt - Does not check his BP at home.   Jan. 23, 2017:  Jaicion is seen today for follow up of his CAD, HTN and hyperlipidemia. He has  lost about 20 lbs over the past several months.  Has had some vertigo symptoms.    Went to the ER , found to have hypertensive,    Was restarted back on HCTZ and potassium .  BP is well controlled this am No CP or dyspnea.    Past Medical History  Diagnosis Date  . Coronary artery disease   . Hypertension   . Dyslipidemia   . MI, old     anterior wall    Past Surgical History  Procedure Laterality Date  . Cardiac catheterization  05/11/1995    normal left ventricular systolic function with ef of 70-75%  . Eye surgery       Current Outpatient Prescriptions  Medication Sig Dispense Refill  . aspirin 81 MG tablet Take 81 mg by mouth daily.      . hydrochlorothiazide (HYDRODIURIL) 25 MG tablet Take 1 tablet (25 mg total) by mouth daily. 31 tablet 11  . lisinopril (PRINIVIL,ZESTRIL) 20 MG tablet TAKE 1 TABLET (20 MG TOTAL) BY MOUTH DAILY. 30 tablet 1  . nitroGLYCERIN (NITROSTAT) 0.4 MG SL tablet Place 1 tablet (0.4 mg total) under the tongue every 5 (five) minutes as needed. 25 tablet 3  . potassium chloride (K-DUR) 10 MEQ tablet Take 1 tablet (10 mEq total) by mouth daily. 31 tablet 11  .  LORazepam (ATIVAN) 1 MG tablet Take 1 tablet (1 mg total) by mouth every 8 (eight) hours as needed (breakthrough vertigo). (Patient not taking: Reported on 11/18/2015) 15 tablet 0  . meclizine (ANTIVERT) 50 MG tablet Take 0.5 tablets (25 mg total) by mouth 3 (three) times daily as needed for dizziness. (Patient not taking: Reported on 11/18/2015) 30 tablet 0   No current facility-administered medications for this visit.    Allergies:   Lipitor; Antivert; and Iodinated diagnostic agents    Social History:  The patient  reports that he has never smoked. He does not have any smokeless tobacco history on file. He reports that he does not drink alcohol or use illicit drugs.   Family History:  The patient's family history includes Cancer in his mother; Heart Problems in his father.    ROS:  Please  see the history of present illness.   Otherwise, review of systems are positive for none.   All other systems are reviewed and negative.    PHYSICAL EXAM: VS:  BP 132/58 mmHg  Pulse 57  Ht  (1.702 m)  Wt 196 lb 12 oz (89.245 kg)  BMI 30.81 kg/m2  SpO2 96% , BMI Body mass index is 30.81 kg/(m^2). GEN: Well nourished, well developed, in no acute distress HEENT: normal Neck: no JVD, carotid bruits, or masses Cardiac: RRR; no murmurs, rubs, or gallops,no edema  Respiratory:  clear to auscultation bilaterally, normal work of breathing GI: soft, nontender, nondistended, + BS MS: no deformity or atrophy Skin: warm and dry, no rash Neuro:  Strength and sensation are intact Psych: euthymic mood, full affect   EKG:  EKG is not ordered today.  Recent Labs: 10/11/2015: BUN 16; Creatinine, Ser 1.13; Hemoglobin 16.2; Platelets 98*; Potassium 4.2; Sodium 141    Lipid Panel    Component Value Date/Time   CHOL 186 04/24/2014 1246   TRIG 121.0 04/24/2014 1246   HDL 32.80* 04/24/2014 1246   CHOLHDL 6 04/24/2014 1246   VLDL 24.2 04/24/2014 1246   LDLCALC 129* 04/24/2014 1246      Wt Readings from Last 3 Encounters:  11/18/15 196 lb 12 oz (89.245 kg)  11/15/14 210 lb 6.4 oz (95.437 kg)  04/24/14 199 lb 12.8 oz (90.629 kg)      Other studies Reviewed: Additional studies/ records that were reviewed today include: . Review of the above records demonstrates:    ASSESSMENT AND PLAN:  1.  1. Coronary artery disease: The patient has a history of antral wall myocardial infarction past. He's not had any episodes of chest pain. Continue same medications.  2. Hypertension:   BP is currently well-controlled. I suspect that his weight loss has greatly facilitated his blood pressure control.  3. Hyperlipidemia: We will check fasting lipids today.  Current medicines are reviewed at length with the patient today.  The patient does not have concerns regarding medicines.  The following  changes have been made:  None   Labs/ tests ordered today include:   Orders Placed This Encounter  Procedures  . Basic Metabolic Panel (BMET)  . TSH  . Hepatic function panel  . Lipid panel     Disposition:   FU with me  in 6 months   Signed, Nahser, Deloris Ping, MD  11/18/2015 8:39 AM    [facility] [address] [place], [place], [place]  [address] Phone: [phone]; Fax: [phone]

## 2016-01-05 ENCOUNTER — Other Ambulatory Visit: Payer: Self-pay | Admitting: Cardiovascular Disease

## 2016-02-03 ENCOUNTER — Other Ambulatory Visit: Payer: Self-pay | Admitting: Cardiovascular Disease

## 2016-02-18 ENCOUNTER — Telehealth: Payer: Self-pay | Admitting: Cardiovascular Disease

## 2016-02-18 NOTE — Telephone Encounter (Signed)
New message      Calling to report that patient's presc was filled for klora-con 10 meq instead of klor-con M . Pt did not take the medication---he brought back the "reqular" klor-con and got the klora-con M.  Pharmacist had to call and report this to the physician.

## 2016-02-18 NOTE — Telephone Encounter (Signed)
These should be the sam e

## 2016-06-10 ENCOUNTER — Encounter: Payer: Self-pay | Admitting: Cardiovascular Disease

## 2016-06-10 ENCOUNTER — Ambulatory Visit (INDEPENDENT_AMBULATORY_CARE_PROVIDER_SITE_OTHER): Payer: Medicare Other | Admitting: Cardiovascular Disease

## 2016-06-10 VITALS — BP 120/78 | HR 59 | Ht 67.0 in | Wt 199.0 lb

## 2016-06-10 DIAGNOSIS — I252 Old myocardial infarction: Secondary | ICD-10-CM | POA: Diagnosis not present

## 2016-06-10 DIAGNOSIS — I10 Essential (primary) hypertension: Secondary | ICD-10-CM | POA: Diagnosis not present

## 2016-06-10 DIAGNOSIS — I2510 Atherosclerotic heart disease of native coronary artery without angina pectoris: Secondary | ICD-10-CM

## 2016-06-10 LAB — COMPREHENSIVE METABOLIC PANEL
ALK PHOS: 48 U/L (ref 40–115)
ALT: 28 U/L (ref 9–46)
AST: 53 U/L — AB (ref 10–35)
Albumin: 4.1 g/dL (ref 3.6–5.1)
BILIRUBIN TOTAL: 0.7 mg/dL (ref 0.2–1.2)
BUN: 27 mg/dL — AB (ref 7–25)
CALCIUM: 9.5 mg/dL (ref 8.6–10.3)
CO2: 24 mmol/L (ref 20–31)
Chloride: 102 mmol/L (ref 98–110)
Creat: 1.37 mg/dL — ABNORMAL HIGH (ref 0.70–1.18)
GLUCOSE: 119 mg/dL — AB (ref 65–99)
Potassium: 4.5 mmol/L (ref 3.5–5.3)
SODIUM: 138 mmol/L (ref 135–146)
Total Protein: 6.6 g/dL (ref 6.1–8.1)

## 2016-06-10 LAB — LIPID PANEL
CHOL/HDL RATIO: 5.1 ratio — AB (ref <=5.0)
CHOLESTEROL: 190 mg/dL (ref 125–200)
HDL: 37 mg/dL — ABNORMAL LOW (ref >=40)
LDL CALC: 132 mg/dL — AB (ref <130)
Triglycerides: 105 mg/dL (ref <150)
VLDL: 21 mg/dL (ref <30)

## 2016-06-10 NOTE — Progress Notes (Signed)
Cardiology Office No   Date:  06/10/2016   ID:  Clinton Garza, DOB 12/26/1935, MRN [id number]  PCP:  Mickie HillierLITTLE,KEVIN LORNE, MD  Cardiologist:   Kristeen MissPhilip Treshon Stannard, MD   Chief Complaint  Patient presents with  . Coronary Artery Disease      History of Present Illness: Clinton LinsDavid Garza is a [age] y.o. male who presents for   1. Coronary artery disease-status post anterior wall myocardial infarction in 1996. He status post PTCA of the LAD 2. Hyperlipidemia   History of Present Illness:  Clinton Garza is a 80 year old gentleman with a history of coronary artery disease. He status post anterior wall myocardial infarction in 1996. We performed PTCA of his left anterior descending artery. A stress Myoview study performed in 2000 it reveals anteroapical scar but no evidence of ischemia. His left ventricular systolic function was normal at 56%. An echocardiogram performed in May of 2001 reveals an ejection fraction between 55 and 60%. He has hypokinesis and calcification of the apex at the site of his old apical myocardial infarction.  He has gained some weight. No chest pain.   Oct. 3, 2013 - He has been busy - [place] [facility] a [place] [facility] [place] and has been remodeling the house. He has not been getting as much exercise as he would like.  Oct. 22, 2014   Clinton Garza is doing well. [place] working [place] [employer] [place].   He still eats some salt. He denies any angina.   He was placed on atorvastatin 80 mg a day. Unfortunately, he broke out with a drug rash several days after taking medication. He discontinued the Lipitor at that time.   April 24, 2014: Clinton Garza is doing well. No CP. Still eating some salt. Does have some DOe if he walks too fast.   Jan. 21, 2016:    Clinton Garza is doing well.  Has gained some weight - spine issues.  Still eats a little extra salt - Does not check his BP at home.   Jan. 23, 2017:  Clinton Garza is seen today for follow up of his CAD, HTN and hyperlipidemia. He  has lost about 20 lbs over the past several months.  Has had some vertigo symptoms.    Went to the ER , found to have hypertensive,    Was restarted back on HCTZ and potassium .  BP is well controlled this am No CP or dyspnea.   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   No CP or dyspnea.    Had back injections this past Monday - has helped his back pain .    Past Medical History:  Diagnosis Date  . Coronary artery disease   . Dyslipidemia   . Hypertension   . MI, old    anterior wall    Past Surgical History:  Procedure Laterality Date  . CARDIAC CATHETERIZATION  05/11/1995   normal left ventricular systolic function with ef of 70-75%  . EYE SURGERY       Current Outpatient Prescriptions  Medication Sig Dispense Refill  . aspirin 81 MG tablet Take 81 mg by mouth daily.      . hydrochlorothiazide (HYDRODIURIL) 25 MG tablet Take 1 tablet (25 mg total) by mouth daily. 31 tablet 11  . KLOR-CON M10 10 MEQ tablet TAKE 1 TABLET (10 MEQ TOTAL) BY MOUTH DAILY. 30 tablet 4  . lisinopril (PRINIVIL,ZESTRIL) 20 MG tablet TAKE 1 TABLET (20 MG TOTAL) BY MOUTH DAILY. 30 tablet 11  .  LORazepam (ATIVAN) 1 MG tablet Take 1 tablet (1 mg total) by mouth every 8 (eight) hours as needed (breakthrough vertigo). 15 tablet 0  . meclizine (ANTIVERT) 50 MG tablet Take 0.5 tablets (25 mg total) by mouth 3 (three) times daily as needed for dizziness. 30 tablet 0  . nitroGLYCERIN (NITROSTAT) 0.4 MG SL tablet Place 1 tablet (0.4 mg total) under the tongue every 5 (five) minutes as needed. 25 tablet 3   No current facility-administered medications for this visit.     Allergies:   Lipitor [atorvastatin]; Antivert [meclizine hcl]; and Iodinated diagnostic agents    Social History:  The patient  reports that he has never smoked. He does not have any smokeless tobacco history on file. He reports that he does not drink alcohol or use drugs.   Family History:  The patient's family history  includes Cancer in his mother; Heart Problems in his father.    ROS:  Please see the history of present illness.   Otherwise, review of systems are positive for none.   All other systems are reviewed and negative.    PHYSICAL EXAM: VS:  BP (!) 150/80 (BP Location: Left Arm, Patient Position: Sitting, Cuff Size: Normal)   Pulse (!) 59   Ht 5\' 7"  (1.702 m)   Wt 199 lb (90.3 kg)   BMI 31.17 kg/m  , BMI Body mass index is 31.17 kg/m. GEN: Well nourished, well developed, in no acute distress  HEENT: normal  Neck: no JVD, carotid bruits, or masses Cardiac: RRR; no murmurs, rubs, or gallops,no edema  Respiratory:  clear to auscultation bilaterally, normal work of breathing GI: soft, nontender, nondistended, + BS MS: no deformity or atrophy  Skin: warm and dry, no rash Neuro:  Strength and sensation are intact Psych: euthymic mood, full affect   EKG:  EKG is ordered today.   Sinus brady at 59.   LAHB.  TWI in lead 1 and aVL.   Recent Labs: 10/11/2015: Hemoglobin 16.2; Platelets 98 11/18/2015: ALT 24; BUN 21; Creat 1.27; Potassium 4.3; Sodium 140; TSH 4.374    Lipid Panel    Component Value Date/Time   CHOL 135 11/18/2015 0835   TRIG 84 11/18/2015 0835   HDL 32 (L) 11/18/2015 0835   CHOLHDL 4.2 11/18/2015 0835   VLDL 17 11/18/2015 0835   LDLCALC 86 11/18/2015 0835      Wt Readings from Last 3 Encounters:  06/10/16 199 lb (90.3 kg)  11/18/15 196 lb 12 oz (89.2 kg)  11/15/14 210 lb 6.4 oz (95.4 kg)      Other studies Reviewed: Additional studies/ records that were reviewed today include: . Review of the above records demonstrates:    ASSESSMENT AND PLAN:  1.  1. Coronary artery disease: The patient has a history of antral wall myocardial infarction past. He's not had any episodes of chest pain. Continue same medications.  2. Hypertension:   BP is currently well-controlled. I suspect that his weight loss has greatly facilitated his blood pressure control.  3.  Hyperlipidemia: We will check fasting lipids today.  Current medicines are reviewed at length with the patient today.  The patient does not have concerns regarding medicines.  The following changes have been made:  None   Labs/ tests ordered today include:   No orders of the defined types were placed in this encounter.    Disposition:   FU with me  in 6 months   Signed, Kristeen MissPhilip Jerusalem Wert, MD  06/10/2016 9:19 AM  [facility] [place], [place], [place]  [address] Phone: [phone]; Fax: [phone]

## 2016-06-10 NOTE — Patient Instructions (Signed)

## 2016-06-11 ENCOUNTER — Telehealth: Payer: Self-pay | Admitting: Nurse Practitioner

## 2016-06-11 DIAGNOSIS — E785 Hyperlipidemia, unspecified: Secondary | ICD-10-CM

## 2016-06-11 MED ORDER — ROSUVASTATIN CALCIUM 10 MG PO TABS: 10.0000 mg | ORAL_TABLET | Freq: Every day | ORAL | 11 refills | Status: DC

## 2016-06-11 NOTE — Telephone Encounter (Signed)
Reviewed lab results and plan of care to start Rosuvastatin 10 mg daily.  Patient verbalized understanding and agreement.  I advised him to call back to report if he does not tolerate the Rosuvastatin.  I advised he will need repeat fasting lab work in 3 months and scheduled him for lab appointment on 11/21.  He thanked me for the call.

## 2016-06-11 NOTE — Telephone Encounter (Signed)
-----   Message from Vesta MixerPhilip J Nahser, MD sent at 06/10/2016  5:10 PM EDT ----- Chol is higher. He has not tolerated Lipitor. Has he tried crestor 10 mg a day  If not lets start and check labs in 3 months

## 2016-08-28 ENCOUNTER — Other Ambulatory Visit: Payer: Self-pay | Admitting: Cardiovascular Disease

## 2016-09-15 ENCOUNTER — Other Ambulatory Visit: Payer: Medicare Other | Admitting: *Deleted

## 2016-09-15 DIAGNOSIS — E785 Hyperlipidemia, unspecified: Secondary | ICD-10-CM

## 2016-09-15 LAB — LIPID PANEL
CHOLESTEROL: 103 mg/dL (ref <200)
HDL: 29 mg/dL — ABNORMAL LOW (ref >40)
LDL Cholesterol: 51 mg/dL (ref <100)
TRIGLYCERIDES: 115 mg/dL (ref <150)
Total CHOL/HDL Ratio: 3.6 Ratio (ref <5.0)
VLDL: 23 mg/dL (ref <30)

## 2016-09-15 LAB — COMPREHENSIVE METABOLIC PANEL
ALK PHOS: 33 U/L — AB (ref 40–115)
ALT: 20 U/L (ref 9–46)
AST: 27 U/L (ref 10–35)
Albumin: 3.9 g/dL (ref 3.6–5.1)
BUN: 20 mg/dL (ref 7–25)
CALCIUM: 9 mg/dL (ref 8.6–10.3)
CO2: 30 mmol/L (ref 20–31)
Chloride: 104 mmol/L (ref 98–110)
Creat: 1.44 mg/dL — ABNORMAL HIGH (ref 0.70–1.18)
Glucose, Bld: 91 mg/dL (ref 65–99)
POTASSIUM: 4.1 mmol/L (ref 3.5–5.3)
Sodium: 141 mmol/L (ref 135–146)
TOTAL PROTEIN: 6 g/dL — AB (ref 6.1–8.1)
Total Bilirubin: 1 mg/dL (ref 0.2–1.2)

## 2016-11-09 ENCOUNTER — Other Ambulatory Visit: Payer: Self-pay | Admitting: Cardiovascular Disease

## 2016-12-03 ENCOUNTER — Encounter: Payer: Self-pay | Admitting: Cardiovascular Disease

## 2016-12-09 ENCOUNTER — Encounter: Payer: Self-pay | Admitting: Cardiovascular Disease

## 2016-12-09 ENCOUNTER — Ambulatory Visit (INDEPENDENT_AMBULATORY_CARE_PROVIDER_SITE_OTHER): Payer: Medicare Other | Admitting: Cardiovascular Disease

## 2016-12-09 VITALS — BP 136/72 | HR 70 | Ht 66.5 in | Wt 207.0 lb

## 2016-12-09 DIAGNOSIS — I2510 Atherosclerotic heart disease of native coronary artery without angina pectoris: Secondary | ICD-10-CM | POA: Diagnosis not present

## 2016-12-09 DIAGNOSIS — E782 Mixed hyperlipidemia: Secondary | ICD-10-CM

## 2016-12-09 NOTE — Progress Notes (Signed)
Cardiology Office No   Date:  12/09/2016   ID:  Clinton Garza, DOB September 27, 1936, MRN [id number]  PCP:  Mickie Hillier, MD  Cardiologist:   Kristeen Miss, MD    Chief Complaint  Patient presents with  . Coronary Artery Disease      History of Present Illness: Clinton Garza is a 81 y.o. male who presents for   1. Coronary artery disease-status post anterior wall myocardial infarction in 1996. He status post PTCA of the LAD 2. Hyperlipidemia   History of Present Illness:  Clinton Garza is a 81 year old gentleman with a history of coronary artery disease. He status post anterior wall myocardial infarction in 1996. We performed PTCA of his left anterior descending artery. A stress Myoview study performed in 2000 it reveals anteroapical scar but no evidence of ischemia. His left ventricular systolic function was normal at 56%. An echocardiogram performed in May of 2001 reveals an ejection fraction between 55 and 60%. He has hypokinesis and calcification of the apex at the site of his old apical myocardial infarction.  He has gained some weight. No chest pain.   Oct. 3, 2013 - He has been busy - he bought a house at [facility] and has been remodeling the house. He has not been getting as much exercise as he would like.  Oct. 22, 2014   Clinton Garza is doing well. Still working on his [employer] [place].   He still eats some salt. He denies any angina.   He was placed on atorvastatin 80 mg a day. Unfortunately, he broke out with a drug rash several days after taking medication. He discontinued the Lipitor at that time.   April 24, 2014: Clinton Garza is doing well. No CP. Still eating some salt. Does have some DOe if he walks too fast.   Jan. 21, 2016:    Clinton Garza is doing well.  Has gained some weight - spine issues.  Still eats a little extra salt - Does not check his BP at home.   Jan. 23, 2017:  Clinton Garza is seen today for follow up of his CAD, HTN and  hyperlipidemia. He has lost about 20 lbs over the past several months.  Has had some vertigo symptoms.    Went to the ER , found to have hypertensive,    Was restarted back on HCTZ and potassium .  BP is well controlled this am No CP or dyspnea.   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   No CP or dyspnea.    Had back injections this past Monday - has helped his back pain .  Feb. 14, 2018:  No CP or dyspnea.  Having back pain . Has gained some weight .  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 [place] [facility] [place]       Past Medical History:  Diagnosis Date  . Coronary artery disease   . Dyslipidemia   . Hypertension   . MI, old    anterior wall    Past Surgical History:  Procedure Laterality Date  . CARDIAC CATHETERIZATION  05/11/1995   normal left ventricular systolic function with ef of 70-75%  . EYE SURGERY       Current Outpatient Prescriptions  Medication Sig Dispense Refill  . aspirin 81 MG tablet Take 81 mg by mouth daily.      . hydrochlorothiazide (HYDRODIURIL) 25 MG tablet TAKE 1 TABLET (25 MG TOTAL) BY MOUTH  DAILY. 31 tablet 6  . KLOR-CON M10 10 MEQ tablet TAKE 1 TABLET BY MOUTH EVERY DAY 30 tablet 8  . lisinopril (PRINIVIL,ZESTRIL) 20 MG tablet TAKE 1 TABLET (20 MG TOTAL) BY MOUTH DAILY. 30 tablet 11  . Multiple Vitamins-Minerals (EYE VITAMINS) CAPS Take 2 capsules by mouth 2 (two) times daily.    . nitroGLYCERIN (NITROSTAT) 0.4 MG SL tablet Place 1 tablet (0.4 mg total) under the tongue every 5 (five) minutes as needed. 25 tablet 3  . rosuvastatin (CRESTOR) 10 MG tablet Take 1 tablet (10 mg total) by mouth daily. 30 tablet 11   No current facility-administered medications for this visit.     Allergies:   Lipitor [atorvastatin]; Antivert [meclizine hcl]; and Iodinated diagnostic agents    Social History:  The patient  reports that he has never smoked. He has never used smokeless tobacco. He reports that he  does not drink alcohol or use drugs.   Family History:  The patient's family history includes Cancer in his mother; Heart Problems in his father.    ROS:  Please see the history of present illness.   Otherwise, review of systems are positive for none.   All other systems are reviewed and negative.    PHYSICAL EXAM: VS:  BP 136/72   Pulse 70   Ht 5' 6.5" (1.689 m)   Wt 207 lb (93.9 kg)   SpO2 98%   BMI 32.91 kg/m  , BMI Body mass index is 32.91 kg/m. GEN: Well nourished, well developed, in no acute distress  HEENT: normal  Neck: no JVD, carotid bruits, or masses Cardiac: RRR; no murmurs, rubs, or gallops,no edema  Respiratory:  clear to auscultation bilaterally, normal work of breathing GI: soft, nontender, nondistended, + BS MS: no deformity or atrophy  Skin: warm and dry, no rash Neuro:  Strength and sensation are intact Psych: euthymic mood, full affect   EKG:  EKG is ordered today.   Sinus brady at 59.   LAHB.  TWI in lead 1 and aVL.   Recent Labs: 09/15/2016: ALT 20; BUN 20; Creat 1.44; Potassium 4.1; Sodium 141    Lipid Panel    Component Value Date/Time   CHOL 103 09/15/2016 0738   TRIG 115 09/15/2016 0738   HDL 29 (L) 09/15/2016 0738   CHOLHDL 3.6 09/15/2016 0738   VLDL 23 09/15/2016 0738   LDLCALC 51 09/15/2016 0738      Wt Readings from Last 3 Encounters:  12/09/16 207 lb (93.9 kg)  06/10/16 199 lb (90.3 kg)  11/18/15 196 lb 12 oz (89.2 kg)      Other studies Reviewed: Additional studies/ records that were reviewed today include: . Review of the above records demonstrates:    ASSESSMENT AND PLAN:  1.  1. Coronary artery disease: The patient has a history of antral wall myocardial infarction past. He's not had any episodes of chest pain. Continue same medications.  2. Hypertension:   BP is currently well-controlled. I suspect that his weight loss has greatly facilitated his blood pressure control.  3. Hyperlipidemia: We will check fasting  lipids in 6 months  He is tolerating the crestor without any problems  LDL is 54   Current medicines are reviewed at length with the patient today.  The patient does not have concerns regarding medicines.  The following changes have been made:  None   Labs/ tests ordered today include:   No orders of the defined types were placed in this encounter.  Disposition:   FU with me  in 6 months   Signed, Kristeen Miss, MD  12/09/2016 8:37 AM    [facility] [address] [place], [place], [place]  [address] Phone: [phone]; Fax: [phone]

## 2016-12-09 NOTE — Patient Instructions (Signed)

## 2017-01-11 ENCOUNTER — Other Ambulatory Visit: Payer: Self-pay | Admitting: Cardiovascular Disease

## 2017-05-25 ENCOUNTER — Other Ambulatory Visit: Payer: Self-pay | Admitting: Cardiovascular Disease

## 2017-07-04 ENCOUNTER — Other Ambulatory Visit: Payer: Self-pay | Admitting: Cardiovascular Disease

## 2017-07-23 ENCOUNTER — Encounter: Payer: Self-pay | Admitting: Cardiovascular Disease

## 2017-07-23 ENCOUNTER — Ambulatory Visit (INDEPENDENT_AMBULATORY_CARE_PROVIDER_SITE_OTHER): Payer: Medicare Other | Admitting: Cardiovascular Disease

## 2017-07-23 VITALS — BP 124/60 | HR 68 | Resp 16 | Ht 66.0 in | Wt 209.1 lb

## 2017-07-23 DIAGNOSIS — I10 Essential (primary) hypertension: Secondary | ICD-10-CM | POA: Diagnosis not present

## 2017-07-23 DIAGNOSIS — E785 Hyperlipidemia, unspecified: Secondary | ICD-10-CM

## 2017-07-23 DIAGNOSIS — I2510 Atherosclerotic heart disease of native coronary artery without angina pectoris: Secondary | ICD-10-CM

## 2017-07-23 DIAGNOSIS — E782 Mixed hyperlipidemia: Secondary | ICD-10-CM | POA: Diagnosis not present

## 2017-07-23 HISTORY — DX: Mixed hyperlipidemia: E78.2

## 2017-07-23 LAB — BASIC METABOLIC PANEL
BUN / CREAT RATIO: 15 (ref 10–24)
BUN: 18 mg/dL (ref 8–27)
CO2: 26 mmol/L (ref 20–29)
CREATININE: 1.17 mg/dL (ref 0.76–1.27)
Calcium: 9.6 mg/dL (ref 8.6–10.2)
Chloride: 99 mmol/L (ref 96–106)
GFR, EST AFRICAN AMERICAN: 68 mL/min/{1.73_m2} (ref >59)
GFR, EST NON AFRICAN AMERICAN: 59 mL/min/{1.73_m2} — AB (ref >59)
GLUCOSE: 122 mg/dL — AB (ref 65–99)
Potassium: 4.5 mmol/L (ref 3.5–5.2)
SODIUM: 140 mmol/L (ref 134–144)

## 2017-07-23 LAB — HEPATIC FUNCTION PANEL
ALT: 26 IU/L (ref 0–44)
AST: 35 IU/L (ref 0–40)
Albumin: 4.3 g/dL (ref 3.5–4.8)
Alkaline Phosphatase: 50 IU/L (ref 39–117)
Bilirubin Total: 0.9 mg/dL (ref 0.0–1.2)
Bilirubin, Direct: 0.28 mg/dL (ref 0.00–0.40)
TOTAL PROTEIN: 6.7 g/dL (ref 6.0–8.5)

## 2017-07-23 LAB — LIPID PANEL
Chol/HDL Ratio: 4.1 ratio (ref 0.0–5.0)
Cholesterol, Total: 127 mg/dL (ref 100–199)
HDL: 31 mg/dL — AB (ref >39)
LDL Calculated: 64 mg/dL (ref 0–99)
Triglycerides: 162 mg/dL — ABNORMAL HIGH (ref 0–149)
VLDL CHOLESTEROL CAL: 32 mg/dL (ref 5–40)

## 2017-07-23 NOTE — Patient Instructions (Signed)
Medication Instructions:  Your physician recommends that you continue on your current medications as directed. Please refer to the Current Medication list given to you today.   Labwork: TODAY - cholesterol, liver panel, basic metabolic panel   Testing/Procedures: None Ordered   Follow-Up: Your physician wants you to follow-up in: 6 months with Dr. Nahser. You will receive a reminder letter in the mail two months in advance. If you don't receive a letter, please call our office to schedule the follow-up appointment.   If you need a refill on your cardiac medications before your next appointment, please call your pharmacy.   Thank you for choosing [facility]! Mechel Schutter, RN [phone]    

## 2017-07-23 NOTE — Progress Notes (Signed)
Cardiology Office No   Date:  07/23/2017   ID:  Clinton Garza, DOB 02-04-1936, MRN [id number]  PCP:  Catha Gosselin, MD  Cardiologist:   Kristeen Miss, MD    Chief Complaint  Patient presents with  . Coronary Artery Disease      History of Present Illness: Clinton Garza is a 81 y.o. male who presents for   1. Coronary artery disease-status post anterior wall myocardial infarction in 1996. He status post PTCA of the LAD 2. Hyperlipidemia   History of Present Illness:  Clinton Garza is a 81 year old gentleman with a history of coronary artery disease. He status post anterior wall myocardial infarction in 1996. We performed PTCA of his left anterior descending artery. A stress Myoview study performed in 2000 it reveals anteroapical scar but no evidence of ischemia. His left ventricular systolic function was normal at 56%. An echocardiogram performed in May of 2001 reveals an ejection fraction between 55 and 60%. He has hypokinesis and calcification of the apex at the site of his old apical myocardial infarction.  He has gained some weight. No chest pain.   Oct. 3, 2013 - He has been busy - he bought a house at [facility] and has been remodeling the house. He has not been getting as much exercise as he would like.  Oct. 22, 2014   Clinton Garza is doing well. Still working on his [employer]   He still eats some salt. He denies any angina.   He was placed on atorvastatin 80 mg a day. Unfortunately, he broke out with a drug rash several days after taking medication. He discontinued the Lipitor at that time.   April 24, 2014: Clinton Garza is doing well. No CP. Still eating some salt. Does have some DOe if he walks too fast.   Jan. 21, 2016:    Clinton Garza is doing well.  Has gained some weight - spine issues.  Still eats a little extra salt - Does not check his BP at home.   Jan. 23, 2017:  Clinton Garza is seen today for follow up of his CAD, HTN and hyperlipidemia. He has  lost about 20 lbs over the past several months.  Has had some vertigo symptoms.    Went to the ER , found to have hypertensive,    Was restarted back on HCTZ and potassium .  BP is well controlled this am No CP or dyspnea.   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   No CP or dyspnea.    Had back injections this past Monday - has helped his back pain .  Feb. 14, 2018:  No CP or dyspnea.  Having back pain . Has gained some weight .  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 He's done well. He's not had any episodes of chest pain or shortness of breath. Has been gaining some weight.   He's not able to walk as much as he would like because of some back issues.   Past Medical History:  Diagnosis Date  . Coronary artery disease   . Dyslipidemia   . Hypertension   . MI, old    anterior wall    Past Surgical History:  Procedure Laterality Date  . CARDIAC CATHETERIZATION  05/11/1995   normal left ventricular systolic function with ef of 70-75%  . EYE SURGERY  Current Outpatient Prescriptions  Medication Sig Dispense Refill  . aspirin 81 MG tablet Take 81 mg by mouth daily.      . hydrochlorothiazide (HYDRODIURIL) 25 MG tablet TAKE 1 TABLET (25 MG TOTAL) BY MOUTH DAILY. 31 tablet 6  . KLOR-CON M10 10 MEQ tablet TAKE 1 TABLET BY MOUTH EVERY DAY 30 tablet 6  . lisinopril (PRINIVIL,ZESTRIL) 20 MG tablet TAKE 1 TABLET (20 MG TOTAL) BY MOUTH DAILY. 30 tablet 10  . Multiple Vitamins-Minerals (EYE VITAMINS) CAPS Take 2 capsules by mouth 2 (two) times daily.    . nitroGLYCERIN (NITROSTAT) 0.4 MG SL tablet Place 1 tablet (0.4 mg total) under the tongue every 5 (five) minutes as needed. 25 tablet 3  . rosuvastatin (CRESTOR) 10 MG tablet TAKE 1 TABLET BY MOUTH EVERY DAY 30 tablet 3   No current facility-administered medications for this visit.     Allergies:    Lipitor [atorvastatin]; Antivert [meclizine hcl]; and Iodinated diagnostic agents    Social History:  The patient  reports that he has never smoked. He has never used smokeless tobacco. He reports that he does not drink alcohol or use drugs.   Family History:  The patient's family history includes Cancer in his mother; Heart Problems in his father.    ROS:  Please see the history of present illness.   Otherwise, review of systems are positive for none.   All other systems are reviewed and negative.    Physical Exam: Blood pressure 124/60, pulse 68, resp. rate 16, height  (1.676 m), weight 209 lb 1.9 oz (94.9 kg), SpO2 97 %.  GEN:  Well nourished, well developed in no acute distress HEENT: Normal NECK: No JVD; No carotid bruits LYMPHATICS: No lymphadenopathy CARDIAC: RR, soft systolic murmur , no  rubs, gallops RESPIRATORY:  Clear to auscultation without rales, wheezing or rhonchi  ABDOMEN: Soft, non-tender, non-distended MUSCULOSKELETAL:  No edema; No deformity  SKIN: Warm and dry NEUROLOGIC:  Alert and oriented x 3  EKG:  EKG is ordered today.   Sinus brady at 58.  Old ant. MI   Recent Labs: 09/15/2016: ALT 20; BUN 20; Creat 1.44; Potassium 4.1; Sodium 141    Lipid Panel    Component Value Date/Time   CHOL 103 09/15/2016 0738   TRIG 115 09/15/2016 0738   HDL 29 (L) 09/15/2016 0738   CHOLHDL 3.6 09/15/2016 0738   VLDL 23 09/15/2016 0738   LDLCALC 51 09/15/2016 0738      Wt Readings from Last 3 Encounters:  07/23/17 209 lb 1.9 oz (94.9 kg)  12/09/16 207 lb (93.9 kg)  06/10/16 199 lb (90.3 kg)      Other studies Reviewed: Additional studies/ records that were reviewed today include: . Review of the above records demonstrates:    ASSESSMENT AND PLAN:  1.  1. Coronary artery disease:  Patient has a history of antral wall MI many years ago. He's not had any episodes of angina. His lipids have been well-controlled recent.  2. Hypertension:   Blood pressure  continues to be well-controlled. Continue current medications  3. Hyperlipidemia:  He is currently on rosuvastatin.  we'll check labs today.  Current medicines are reviewed at length with the patient today.  The patient does not have concerns regarding medicines.  The following changes have been made:  None   Labs/ tests ordered today include:   Orders Placed This Encounter  Procedures  . EKG 12-Lead     Disposition:   FU with me  in 6 months   Signed, Kristeen Miss, MD  07/23/2017 10:54 AM    [facility] [address] [place], [place], [place]  [address] Phone: [phone]; Fax: [phone]

## 2017-08-17 ENCOUNTER — Other Ambulatory Visit: Payer: Self-pay | Admitting: Cardiovascular Disease

## 2017-08-17 MED ORDER — HYDROCHLOROTHIAZIDE 25 MG PO TABS: 25.0000 mg | ORAL_TABLET | Freq: Every day | ORAL | 3 refills | Status: DC

## 2017-09-02 ENCOUNTER — Other Ambulatory Visit: Payer: Self-pay

## 2017-09-02 MED ORDER — ROSUVASTATIN CALCIUM 10 MG PO TABS: 10.0000 mg | ORAL_TABLET | Freq: Every day | ORAL | 3 refills | Status: DC

## 2017-11-01 ENCOUNTER — Other Ambulatory Visit: Payer: Self-pay | Admitting: Cardiovascular Disease

## 2017-11-01 MED ORDER — LISINOPRIL 20 MG PO TABS: ORAL_TABLET | ORAL | 2 refills | Status: DC

## 2017-11-29 ENCOUNTER — Other Ambulatory Visit: Payer: Self-pay | Admitting: Cardiovascular Disease

## 2017-11-29 MED ORDER — POTASSIUM CHLORIDE CRYS ER 10 MEQ PO TBCR: 10.0000 meq | EXTENDED_RELEASE_TABLET | Freq: Every day | ORAL | 7 refills | Status: DC

## 2017-12-31 ENCOUNTER — Other Ambulatory Visit: Payer: Self-pay | Admitting: *Deleted

## 2017-12-31 MED ORDER — ROSUVASTATIN CALCIUM 10 MG PO TABS: 10.0000 mg | ORAL_TABLET | Freq: Every day | ORAL | 3 refills | Status: DC

## 2018-01-24 ENCOUNTER — Encounter: Payer: Self-pay | Admitting: Cardiovascular Disease

## 2018-01-24 ENCOUNTER — Ambulatory Visit: Payer: Medicare Other | Admitting: Cardiovascular Disease

## 2018-01-24 VITALS — BP 144/64 | HR 54 | Ht 67.0 in | Wt 209.8 lb

## 2018-01-24 DIAGNOSIS — I10 Essential (primary) hypertension: Secondary | ICD-10-CM | POA: Diagnosis not present

## 2018-01-24 DIAGNOSIS — I2510 Atherosclerotic heart disease of native coronary artery without angina pectoris: Secondary | ICD-10-CM

## 2018-01-24 DIAGNOSIS — E782 Mixed hyperlipidemia: Secondary | ICD-10-CM | POA: Diagnosis not present

## 2018-01-24 LAB — HEPATIC FUNCTION PANEL
ALK PHOS: 49 IU/L (ref 39–117)
ALT: 21 IU/L (ref 0–44)
AST: 30 IU/L (ref 0–40)
Albumin: 4.1 g/dL (ref 3.5–4.8)
Bilirubin Total: 0.5 mg/dL (ref 0.0–1.2)
Bilirubin, Direct: 0.19 mg/dL (ref 0.00–0.40)
Total Protein: 6.3 g/dL (ref 6.0–8.5)

## 2018-01-24 LAB — BASIC METABOLIC PANEL
BUN / CREAT RATIO: 12 (ref 10–24)
BUN: 16 mg/dL (ref 8–27)
CO2: 25 mmol/L (ref 20–29)
CREATININE: 1.31 mg/dL — AB (ref 0.76–1.27)
Calcium: 9.4 mg/dL (ref 8.6–10.2)
Chloride: 100 mmol/L (ref 96–106)
GFR calc non Af Amer: 51 mL/min/{1.73_m2} — ABNORMAL LOW (ref >59)
GFR, EST AFRICAN AMERICAN: 59 mL/min/{1.73_m2} — AB (ref >59)
GLUCOSE: 115 mg/dL — AB (ref 65–99)
Potassium: 4.4 mmol/L (ref 3.5–5.2)
SODIUM: 140 mmol/L (ref 134–144)

## 2018-01-24 LAB — LIPID PANEL
Chol/HDL Ratio: 3.4 ratio (ref 0.0–5.0)
Cholesterol, Total: 118 mg/dL (ref 100–199)
HDL: 35 mg/dL — AB (ref >39)
LDL Calculated: 63 mg/dL (ref 0–99)
TRIGLYCERIDES: 99 mg/dL (ref 0–149)
VLDL CHOLESTEROL CAL: 20 mg/dL (ref 5–40)

## 2018-01-24 NOTE — Patient Instructions (Signed)
Medication Instructions:  Your physician recommends that you continue on your current medications as directed. Please refer to the Current Medication list given to you today.   Labwork: TODAY - cholesterol, liver panel, basic metabolic panel   Testing/Procedures: None Ordered   Follow-Up: Your physician wants you to follow-up in: 6 months with Dr. Nahser. You will receive a reminder letter in the mail two months in advance. If you don't receive a letter, please call our office to schedule the follow-up appointment.   If you need a refill on your cardiac medications before your next appointment, please call your pharmacy.   Thank you for choosing [facility]! Jessiah Steinhart, RN [phone]    

## 2018-01-24 NOTE — Progress Notes (Signed)
Cardiology Office No   Date:  01/24/2018   ID:  Clinton Garza, DOB 11-30-1935, MRN [id number]  PCP:  Clinton GosselinLittle, Kevin, MD  Cardiologist:   Clinton MissPhilip Yoland Scherr, MD    No chief complaint on file.     History of Present Illness: Clinton Garza is a 82 y.o. male who presents for   1. Coronary artery disease-status post anterior wall myocardial infarction in 1996. He status post PTCA of the LAD 2. Hyperlipidemia   Clinton Garza is a 82 year old gentleman with a history of coronary artery disease. He status post anterior wall myocardial infarction in 1996. We performed PTCA of his left anterior descending artery. A stress Myoview study performed in 2000 it reveals anteroapical scar but no evidence of ischemia. His left ventricular systolic function was normal at 56%. An echocardiogram performed in May of 2001 reveals an ejection fraction between 55 and 60%. He has hypokinesis and calcification of the apex at the site of his old apical myocardial infarction.  He has gained some weight. No chest pain.   Oct. 3, 2013 - He has been busy - he bought a [place] at [facility] [place] and has been remodeling the house. He has not been getting as much exercise as he would like.  Oct. 22, 2014   Clinton Garza is doing well. Still working on [place] [employer] [place].   He still eats some salt. He denies any angina.   He was placed on atorvastatin 80 mg a day. Unfortunately, he broke out with a drug rash several days after taking medication. He discontinued the Lipitor at that time.   April 24, 2014: Clinton Garza is doing well. No CP. Still eating some salt. Does have some DOe if he walks too fast.   Jan. 21, 2016:    Clinton Garza is doing well.  Has gained some weight - spine issues.  Still eats a Garza extra salt - Does not check his BP at home.   Jan. 23, 2017:  Clinton Garza is seen today for follow up of his CAD, HTN and hyperlipidemia. He has lost about 20 lbs over the past several months.  Has had some vertigo  symptoms.    Went to the ER , found to have hypertensive,    Was restarted back on HCTZ and potassium .  BP is well controlled this am No CP or dyspnea.   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   No CP or dyspnea.    Had back injections this past Monday - has helped his back pain .  Feb. 14, 2018:  No CP or dyspnea.  Having back pain . Has gained some weight .  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 He's done well. He's not had any episodes of chest pain or shortness of breath. Has been gaining some weight.   He's not able to walk as much as he would like because of some back issues.  January 24, 2018:   Clinton Garza is seen today for follow up of his CAD, HTN and hyperlipidemia  Needs to exercise more,  Has back pain  [place] [facility]( [place]     Past Medical History:  Diagnosis Date  . Coronary artery disease   . Dyslipidemia   . Hypertension   . MI, old    anterior wall  Past Surgical History:  Procedure Laterality Date  . CARDIAC CATHETERIZATION  05/11/1995   normal left ventricular systolic function with ef of 70-75%  . EYE SURGERY       Current Outpatient Medications  Medication Sig Dispense Refill  . aspirin 81 MG tablet Take 81 mg by mouth daily.      . hydrochlorothiazide (HYDRODIURIL) 25 MG tablet Take 1 tablet (25 mg total) by mouth daily. 90 tablet 3  . lisinopril (PRINIVIL,ZESTRIL) 20 MG tablet TAKE 1 TABLET (20 MG TOTAL) BY MOUTH DAILY. 90 tablet 2  . Multiple Vitamins-Minerals (EYE VITAMINS) CAPS Take 2 capsules by mouth 2 (two) times daily.    . nitroGLYCERIN (NITROSTAT) 0.4 MG SL tablet Place 1 tablet (0.4 mg total) under the tongue every 5 (five) minutes as needed. 25 tablet 3  . potassium chloride (KLOR-CON M10) 10 MEQ tablet Take 1 tablet (10 mEq total) by mouth daily. 30 tablet 7  . rosuvastatin (CRESTOR) 10  MG tablet Take 1 tablet (10 mg total) by mouth daily. 30 tablet 3   No current facility-administered medications for this visit.     Allergies:   Lipitor [atorvastatin]; Antivert [meclizine hcl]; and Iodinated diagnostic agents    Social History:  The patient  reports that he has never smoked. He has never used smokeless tobacco. He reports that he does not drink alcohol or use drugs.   Family History:  The patient's family history includes Cancer in his mother; Heart Problems in his father.    ROS:  Please see the history of present illness.   Otherwise, review of systems are positive for none.   All other systems are reviewed and negative.   Physical Exam: There were no vitals taken for this visit.  GEN:  Well nourished, well developed in no acute distress HEENT: Normal NECK: No JVD; No carotid bruits LYMPHATICS: No lymphadenopathy CARDIAC: RR, distant heart sounds RESPIRATORY:  Clear to auscultation without rales, wheezing or rhonchi  ABDOMEN: Soft, non-tender, non-distended MUSCULOSKELETAL:  No edema; No deformity  SKIN: Warm and dry NEUROLOGIC:  Alert and oriented x 3  EKG:     Recent Labs: 07/23/2017: ALT 26; BUN 18; Creatinine, Ser 1.17; Potassium 4.5; Sodium 140    Lipid Panel    Component Value Date/Time   CHOL 127 07/23/2017 1106   TRIG 162 (H) 07/23/2017 1106   HDL 31 (L) 07/23/2017 1106   CHOLHDL 4.1 07/23/2017 1106   CHOLHDL 3.6 09/15/2016 0738   VLDL 23 09/15/2016 0738   LDLCALC 64 07/23/2017 1106      Wt Readings from Last 3 Encounters:  07/23/17 209 lb 1.9 oz (94.9 kg)  12/09/16 207 lb (93.9 kg)  06/10/16 199 lb (90.3 kg)      Other studies Reviewed: Additional studies/ records that were reviewed today include: . Review of the above records demonstrates:    ASSESSMENT AND PLAN:  1.   Coronary artery disease: Clinton Garza is doing well from a cardiac standpoint.  He is not having any episodes of angina.  Continue current medications.  Of advised  him back to exercise more.  2. Hypertension:    Blood pressure is well controlled.  To new current medications.  3. Hyperlipidemia: We will check labs today.  Continue Crestor 10 mg a day.    Current medicines are reviewed at length with the patient today.  The patient does not have concerns regarding medicines.  The following changes have been made:  None   Labs/ tests ordered today  include:   No orders of the defined types were placed in this encounter.    Disposition:   FU with me  in 6 months   Signed, Clinton Miss, MD  01/24/2018 8:07 AM    [facility] [address] [place], [place], [place]  [address] Phone: [phone]; Fax: [phone]

## 2018-03-27 ENCOUNTER — Other Ambulatory Visit: Payer: Self-pay | Admitting: Cardiovascular Disease

## 2018-06-10 ENCOUNTER — Other Ambulatory Visit: Payer: Self-pay | Admitting: Orthopedic Surgery

## 2018-06-10 DIAGNOSIS — M25561 Pain in right knee: Secondary | ICD-10-CM

## 2018-06-10 DIAGNOSIS — R531 Weakness: Secondary | ICD-10-CM

## 2018-06-12 ENCOUNTER — Ambulatory Visit
Admission: RE | Admit: 2018-06-12 | Discharge: 2018-06-12 | Disposition: A | Discharge status: Home / Self Care | Payer: Medicare Other | Source: Ambulatory Visit | Attending: Orthopedic Surgery | Admitting: Orthopedic Surgery

## 2018-06-12 DIAGNOSIS — R531 Weakness: Secondary | ICD-10-CM

## 2018-06-12 DIAGNOSIS — M25561 Pain in right knee: Secondary | ICD-10-CM

## 2018-07-07 ENCOUNTER — Other Ambulatory Visit: Payer: Self-pay | Admitting: *Deleted

## 2018-07-07 MED ORDER — NITROGLYCERIN 0.4 MG SL SUBL: 0.4000 mg | SUBLINGUAL_TABLET | SUBLINGUAL | 3 refills | Status: DC | PRN

## 2018-07-17 ENCOUNTER — Other Ambulatory Visit: Payer: Self-pay | Admitting: Cardiovascular Disease

## 2018-07-20 ENCOUNTER — Ambulatory Visit: Payer: Medicare Other | Admitting: Cardiovascular Disease

## 2018-07-20 ENCOUNTER — Encounter: Payer: Self-pay | Admitting: Cardiovascular Disease

## 2018-07-20 VITALS — BP 112/66 | HR 60 | Ht 67.0 in | Wt 203.1 lb

## 2018-07-20 DIAGNOSIS — E782 Mixed hyperlipidemia: Secondary | ICD-10-CM | POA: Diagnosis not present

## 2018-07-20 DIAGNOSIS — I2510 Atherosclerotic heart disease of native coronary artery without angina pectoris: Secondary | ICD-10-CM

## 2018-07-20 LAB — LIPID PANEL
CHOLESTEROL TOTAL: 102 mg/dL (ref 100–199)
Chol/HDL Ratio: 2.9 ratio (ref 0.0–5.0)
HDL: 35 mg/dL — ABNORMAL LOW (ref >39)
LDL Calculated: 52 mg/dL (ref 0–99)
Triglycerides: 77 mg/dL (ref 0–149)
VLDL Cholesterol Cal: 15 mg/dL (ref 5–40)

## 2018-07-20 LAB — HEPATIC FUNCTION PANEL
ALBUMIN: 4.2 g/dL (ref 3.5–4.7)
ALK PHOS: 77 IU/L (ref 39–117)
ALT: 21 IU/L (ref 0–44)
AST: 27 IU/L (ref 0–40)
BILIRUBIN TOTAL: 0.7 mg/dL (ref 0.0–1.2)
Bilirubin, Direct: 0.3 mg/dL (ref 0.00–0.40)
Total Protein: 6.3 g/dL (ref 6.0–8.5)

## 2018-07-20 LAB — BASIC METABOLIC PANEL
BUN/Creatinine Ratio: 17 (ref 10–24)
BUN: 29 mg/dL — ABNORMAL HIGH (ref 8–27)
CALCIUM: 9.5 mg/dL (ref 8.6–10.2)
CO2: 26 mmol/L (ref 20–29)
CREATININE: 1.66 mg/dL — AB (ref 0.76–1.27)
Chloride: 101 mmol/L (ref 96–106)
GFR calc Af Amer: 44 mL/min/{1.73_m2} — ABNORMAL LOW (ref >59)
GFR, EST NON AFRICAN AMERICAN: 38 mL/min/{1.73_m2} — AB (ref >59)
GLUCOSE: 120 mg/dL — AB (ref 65–99)
Potassium: 4.5 mmol/L (ref 3.5–5.2)
SODIUM: 142 mmol/L (ref 134–144)

## 2018-07-20 NOTE — Progress Notes (Signed)
Cardiology Office No   Date:  07/20/2018   ID:  Clinton Garza, DOB Jul 27, 1936, MRN [id number]  PCP:  Catha Gosselin, MD  Cardiologist:   Kristeen Miss, MD    Chief Complaint  Patient presents with  . Coronary Artery Disease  . Hypertension     Clinton Garza is a 82 y.o. male who presents for   1. Coronary artery disease-status post anterior wall myocardial infarction in 1996. He status post PTCA of the LAD 2. Hyperlipidemia   Clinton Garza is a 82 year old gentleman with a history of coronary artery disease. He status post anterior wall myocardial infarction in 1996. We performed PTCA of his left anterior descending artery. A stress Myoview study performed in 2000 it reveals anteroapical scar but no evidence of ischemia. His left ventricular systolic function was normal at 56%. An echocardiogram performed in May of 2001 reveals an ejection fraction between 55 and 60%. He has hypokinesis and calcification of the apex at the site of his old apical myocardial infarction.  He has gained some weight. No chest pain.   Oct. 3, 2013 - He has been busy - he bought a house at [facility] and has been remodeling the house. He has not been getting as much exercise as he would like.  Oct. 22, 2014   Clinton Garza is doing well. Still working on his [employer] [place].   He still eats some salt. He denies any angina.   He was placed on atorvastatin 80 mg a day. Unfortunately, he broke out with a drug rash several days after taking medication. He discontinued the Lipitor at that time.   April 24, 2014: Clinton Garza is doing well. No CP. Still eating some salt. Does have some DOe if he walks too fast.   Jan. 21, 2016:    Clinton Garza is doing well.  Has gained some weight - spine issues.  Still eats a little extra salt - Does not check his BP at home.   Jan. 23, 2017:  Clinton Garza is seen today for follow up of his CAD, HTN and hyperlipidemia. He has lost about 20 lbs over the past several  months.  Has had some vertigo symptoms.    Went to the ER , found to have hypertensive,    Was restarted back on HCTZ and potassium .  BP is well controlled this am No CP or dyspnea.   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   No CP or dyspnea.    Had back injections this past Monday - has helped his back pain .  Feb. 14, 2018:  No CP or dyspnea.  Having back pain . Has gained some weight .  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 He's done well. He's not had any episodes of chest pain or shortness of breath. Has been gaining some weight.   He's not able to walk as much as he would like because of some back issues.  January 24, 2018:   Clinton Garza is seen today for follow up of his CAD, HTN and hyperlipidemia  Needs to exercise more,  Has back pain  [place] [facility] [place]   July 20, 2018:  Clinton Garza is seen today for follow-up of his coronary artery disease and hyperlipidemia.  He has had some recent knee surgery and is walks  with a cane. Has knee swelling  Has wet macular degeneration. Doing well from a cardiac standpoint .     Past Medical History:  Diagnosis Date  . Coronary artery disease   . Dyslipidemia   . Hypertension   . MI, old    anterior wall  . Mixed hyperlipidemia 07/23/2017    Past Surgical History:  Procedure Laterality Date  . CARDIAC CATHETERIZATION  05/11/1995   normal left ventricular systolic function with ef of 70-75%  . EYE SURGERY       Current Outpatient Medications  Medication Sig Dispense Refill  . aspirin 81 MG tablet Take 81 mg by mouth daily.      . hydrochlorothiazide (HYDRODIURIL) 25 MG tablet Take 1 tablet (25 mg total) by mouth daily. 90 tablet 3  . Iodoquinol-HC (HYDROCORTISONE-IODOQUINOL) 1-1 % CREA Place 1 application onto the skin 2 (two) times daily as needed for rash.  1  . KLOR-CON  M10 10 MEQ tablet TAKE 1 TABLET BY MOUTH EVERY DAY 90 tablet 1  . lisinopril (PRINIVIL,ZESTRIL) 20 MG tablet TAKE 1 TABLET (20 MG TOTAL) BY MOUTH DAILY. 90 tablet 2  . Multiple Vitamins-Minerals (EYE VITAMINS) CAPS Take 2 capsules by mouth 2 (two) times daily.    . nitroGLYCERIN (NITROSTAT) 0.4 MG SL tablet Place 1 tablet (0.4 mg total) under the tongue every 5 (five) minutes as needed. 25 tablet 3  . rosuvastatin (CRESTOR) 10 MG tablet TAKE 1 TABLET BY MOUTH EVERY DAY 30 tablet 11   No current facility-administered medications for this visit.     Allergies:   Lipitor [atorvastatin]; Antivert [meclizine hcl]; and Iodinated diagnostic agents    Social History:  The patient  reports that he has never smoked. He has never used smokeless tobacco. He reports that he does not drink alcohol or use drugs.   Family History:  The patient's family history includes Cancer in his mother; Heart Problems in his father.    ROS:  Please see the history of present illness.   Otherwise, review of systems are positive for none.   All other systems are reviewed and negative.   Physical Exam: Blood pressure 112/66, pulse 60, height 5\' 7"  (1.702 m), weight 203 lb 2.1 oz (92.1 kg), head circumference 67" (170.2 cm), SpO2 97 %.  GEN:  Well nourished, well developed in no acute distress HEENT: Normal NECK: No JVD; No carotid bruits LYMPHATICS: No lymphadenopathy CARDIAC: RRR   RESPIRATORY:  Clear to auscultation without rales, wheezing or rhonchi  ABDOMEN: Soft, non-tender, non-distended MUSCULOSKELETAL:  No edema; No deformity  SKIN: Warm and dry NEUROLOGIC:  Alert and oriented x 3   EKG:   July 20, 2018: Normal sinus rhythm at 60.  Previous anterior wall myocardial infarction.  No ST or T wave changes.  Recent Labs: 07/20/2018: ALT 21; BUN 29; Creatinine, Ser 1.66; Potassium 4.5; Sodium 142    Lipid Panel    Component Value Date/Time   CHOL 102 07/20/2018 0907   TRIG 77 07/20/2018 0907   HDL  35 (L) 07/20/2018 0907   CHOLHDL 2.9 07/20/2018 0907   CHOLHDL 3.6 09/15/2016 0738   VLDL 23 09/15/2016 0738   LDLCALC 52 07/20/2018 0907      Wt Readings from Last 3 Encounters:  07/20/18 203 lb 2.1 oz (92.1 kg)  01/24/18 209 lb 12.8 oz (95.2 kg)  07/23/17 209 lb 1.9 oz (94.9 kg)      Other studies Reviewed: Additional studies/ records that were reviewed today include: .  Review of the above records demonstrates:    ASSESSMENT AND PLAN:  1.   Coronary artery disease:  Doing well . No angina .  2. Hypertension:    BP is well controlled.  Continue current meds  3. Hyperlipidemia:     Labs have been stable . Check labs today    Current medicines are reviewed at length with the patient today.  The patient does not have concerns regarding medicines.  The following changes have been made:  None   Labs/ tests ordered today include:   Orders Placed This Encounter  Procedures  . Lipid Profile  . Basic Metabolic Panel (BMET)  . Hepatic function panel  . EKG 12-Lead     Disposition:      Signed, Kristeen Miss, MD  07/20/2018 5:47 PM    [facility] [address] [place], [place], [place]  [address] Phone: [phone]; Fax: [phone]

## 2018-07-20 NOTE — Patient Instructions (Signed)

## 2018-08-24 ENCOUNTER — Other Ambulatory Visit: Payer: Self-pay | Admitting: Cardiovascular Disease

## 2018-10-23 ENCOUNTER — Other Ambulatory Visit: Payer: Self-pay | Admitting: Cardiovascular Disease

## 2019-01-11 ENCOUNTER — Telehealth: Payer: Self-pay | Admitting: Nurse Practitioner

## 2019-01-11 NOTE — Telephone Encounter (Signed)
Spoke with patient's wife who states patient is not having any cardiac symptoms and is agreeable to reschedule appointment for a later time due to Covid 19. I advised that our office will call back later to reschedule the patient.

## 2019-01-16 ENCOUNTER — Ambulatory Visit: Payer: Medicare Other | Admitting: Cardiovascular Disease

## 2019-01-19 ENCOUNTER — Other Ambulatory Visit: Payer: Self-pay | Admitting: Cardiovascular Disease

## 2019-02-10 ENCOUNTER — Telehealth: Payer: Self-pay | Admitting: Cardiology

## 2019-02-10 NOTE — Telephone Encounter (Signed)
Spoke with patient who confirmed all demographics. He is actively on My chart and has smart phone. Will have vitals for visit.

## 2019-02-13 NOTE — Progress Notes (Signed)
Virtual Visit via Video Note   This visit type was conducted due to national recommendations for restrictions regarding the COVID-19 Pandemic (e.g. social distancing) in an effort to limit this patient's exposure and mitigate transmission in our community.  Due to his co-morbid illnesses, this patient is at least at moderate risk for complications without adequate follow up.  This format is felt to be most appropriate for this patient at this time.  All issues noted in this document were discussed and addressed.  A limited physical exam was performed with this format.  Please refer to the patient's chart for his consent to telehealth for [facility].   Evaluation Performed:  Follow-up visit  Date:  02/15/2019   ID:  Clinton Garza, DOB Oct 22, 1936, MRN [id number]  Patient Location: Home Provider Location: Home  PCP:  Catha Gosselin, MD  Cardiologist:  Kristeen Miss, MD   Chief Complaint:  Follow up for CAD, HTN, HLD, seen for Dr. Elease Hashimoto  History of Present Illness:    Clinton Garza is a 83 y.o. male with prior history of CAD s/p MI 1996 (s/p PTCA of LAD), HTN and hyperlipidemia.  A Myoview stress test performed 2000 and revealed an antero-apical scar but no evidence of ischemia. LVEF was noted to be normal at 56%.  An echocardiogram performed 02/2000 revealed an LVEF of 55 to 60% with hypokinesis and calcification of the apex at the site of his old apical MI.  He has noted to be intolerant to Lipitor and is now on Crestor.  He was last seen by his primary cardiologist 07/20/2018.  At that time he had recent knee surgery and was walking with a cane. He was noted to be doing well from a cardiac standpoint.  Today, he states that he is doing well.  He underwent back surgery 02/06/2019 and is been getting back to his physical baseline.  He states he does not sit still much and has been doing "his own physical therapy ".  He denies chest pain, palpitations, shortness of breath, LE swelling, PND,  orthopnea symptoms or syncope.  He is compliant with his medications.  He was taking 2 tablets of Aleve every 12 hours however, his wife told him that this was probably too much NSAIDs.  We discussed how this could further increase his kidney function as well as drive his blood pressures up and he is in agreements and has not been taking any in several weeks.  He has a follow-up appointment with his PCP next month for complete physical exam as well as lab test.  My most concern is his creatinine level which appears to be moderately elevated at baseline.  We discussed increasing fluid intake as the weather gets warmer.  He is to have PCP office fax those lab results to our office.  He is on lisinopril as well as HCTZ however his BP is very well controlled and is reluctant to change medications at this time.   The patient does not have symptoms concerning for COVID-19 infection (fever, chills, cough, or new shortness of breath).   Past Medical History:  Diagnosis Date  . Coronary artery disease   . Dyslipidemia   . Hypertension   . MI, old    anterior wall  . Mixed hyperlipidemia 07/23/2017   Past Surgical History:  Procedure Laterality Date  . CARDIAC CATHETERIZATION  05/11/1995   normal left ventricular systolic function with ef of 70-75%  . EYE SURGERY       Current Meds  Medication Sig  . aspirin 81 MG tablet Take 81 mg by mouth daily.    . diazepam (VALIUM) 5 MG tablet Take 5 mg by mouth every 6 (six) hours as needed (back surgery).   . hydrochlorothiazide (HYDRODIURIL) 25 MG tablet TAKE 1 TABLET BY MOUTH EVERY DAY  . HYDROcodone-acetaminophen (NORCO/VICODIN) 5-325 MG tablet Take 1-2 tablets by mouth every 6 (six) hours as needed for moderate pain (back surgery). for pain  . Iodoquinol-HC (HYDROCORTISONE-IODOQUINOL) 1-1 % CREA Place 1 application onto the skin 2 (two) times daily as needed for rash.  Marland Kitchen. KLOR-CON M10 10 MEQ tablet TAKE 1 TABLET BY MOUTH EVERY DAY  . lisinopril  (PRINIVIL,ZESTRIL) 20 MG tablet TAKE 1 TABLET BY MOUTH EVERY DAY  . Multiple Vitamins-Minerals (EYE VITAMINS) CAPS Take 1 capsule by mouth 2 (two) times daily.   . nitroGLYCERIN (NITROSTAT) 0.4 MG SL tablet Place 1 tablet (0.4 mg total) under the tongue every 5 (five) minutes as needed.  Marland Kitchen. oxyCODONE-acetaminophen (PERCOCET/ROXICET) 5-325 MG tablet Take 1 tablet by mouth every 6 (six) hours as needed for moderate pain (back surgery).   . rosuvastatin (CRESTOR) 10 MG tablet TAKE 1 TABLET BY MOUTH EVERY DAY     Allergies:   Lipitor [atorvastatin]; Antivert [meclizine hcl]; and Iodinated diagnostic agents   Social History   Tobacco Use  . Smoking status: Never Smoker  . Smokeless tobacco: Never Used  Substance Use Topics  . Alcohol use: No  . Drug use: No     Family Hx: The patient's family history includes Cancer in his mother; Heart Problems in his father.  ROS:   Please see the history of present illness.     All other systems reviewed and are negative.  Prior CV studies:   The following studies were reviewed today:  Echocardiogram 03/13/2010: Results scanned into Epic  Labs/Other Tests and Data Reviewed:    EKG:  An ECG dated 07/20/2018 was personally reviewed today and demonstrated:  Sinus bradycardia  Recent Labs: 07/20/2018: ALT 21; BUN 29; Creatinine, Ser 1.66; Potassium 4.5; Sodium 142   Recent Lipid Panel Lab Results  Component Value Date/Time   CHOL 102 07/20/2018 09:07 AM   TRIG 77 07/20/2018 09:07 AM   HDL 35 (L) 07/20/2018 09:07 AM   CHOLHDL 2.9 07/20/2018 09:07 AM   CHOLHDL 3.6 09/15/2016 07:38 AM   LDLCALC 52 07/20/2018 09:07 AM    Wt Readings from Last 3 Encounters:  02/15/19 195 lb (88.5 kg)  07/20/18 203 lb 2.1 oz (92.1 kg)  01/24/18 209 lb 12.8 oz (95.2 kg)     Objective:    Vital Signs:  BP 134/77   Pulse 60   Temp (!) 97.1 F (36.2 C)   Ht 5\' 7"  (1.702 m)   Wt 195 lb (88.5 kg)   SpO2 96%   BMI 30.54 kg/m    VITAL SIGNS:  reviewed  GEN:  no acute distress EYES:  sclerae anicteric, EOMI - Extraocular Movements Intact RESPIRATORY:  normal respiratory effort, symmetric expansion CARDIOVASCULAR:  no peripheral edema SKIN:  no rash, lesions or ulcers. MUSCULOSKELETAL:  no obvious deformities. NEURO:  alert and oriented x 3, no obvious focal deficit PSYCH:  normal affect  ASSESSMENT & PLAN:    1.  Coronary artery disease s/p MI with PTCA to LAD: -Doing well, no anginal symptoms -Continue ASA 81, lisinopril 20 -Recently underwent back surgery 02/06/2019 and is already increasing physical activity near his baseline.  2.  Hypertension: -Stable, 134/77 -Continue current regimen lisinopril  20, hydrochlorothiazide 25 mg daily with daily K+ supplementation 10 mEq -Patient to have complete lab work done by PCP next month and have results forwarded to our office for review  3.  Hyperlipidemia: -Last LDL, 52 on 07/20/2018 -Continue Crestor 10 mg daily -Noted to be intolerant to Lipitor in the past  4.  CKD stage III: -Last creatinine from 07/20/2018, 1.6 with a baseline of-1.4 -Patient to have full labs next month with PCP and to have them faxed to our office for review -Encourage increased fluid intake during warmer months -Consider adjustment of lisinopril and HCTZ based on creatinine results next month -BP very well controlled and patient reluctant to change much at this time   COVID-19 Education: The signs and symptoms of COVID-19 were discussed with the patient and how to seek care for testing (follow up with PCP or arrange E-visit). The importance of social distancing was discussed today.  Time:   Today, I have spent 25 minutes with the patient with telehealth technology discussing the above problems.     Medication Adjustments/Labs and Tests Ordered: Current medicines are reviewed at length with the patient today.  Concerns regarding medicines are outlined above.   Tests Ordered: No orders of the defined types  were placed in this encounter.   Medication Changes: No orders of the defined types were placed in this encounter.   Disposition:  Follow up Dr. Elease Hashimoto in 6 months or sooner if needed  Signed, Georgie Chard, NP  02/15/2019 10:38 AM    [facility]

## 2019-02-15 ENCOUNTER — Other Ambulatory Visit: Payer: Self-pay

## 2019-02-15 ENCOUNTER — Telehealth (INDEPENDENT_AMBULATORY_CARE_PROVIDER_SITE_OTHER): Payer: Medicare Other | Admitting: Cardiology

## 2019-02-15 ENCOUNTER — Encounter: Payer: Self-pay | Admitting: Cardiology

## 2019-02-15 VITALS — BP 134/77 | HR 60 | Temp 97.1°F | Ht 67.0 in | Wt 195.0 lb

## 2019-02-15 DIAGNOSIS — N183 Chronic kidney disease, stage 3 unspecified: Secondary | ICD-10-CM

## 2019-02-15 DIAGNOSIS — I10 Essential (primary) hypertension: Secondary | ICD-10-CM

## 2019-02-15 DIAGNOSIS — E782 Mixed hyperlipidemia: Secondary | ICD-10-CM

## 2019-02-15 DIAGNOSIS — I2510 Atherosclerotic heart disease of native coronary artery without angina pectoris: Secondary | ICD-10-CM

## 2019-02-15 NOTE — Patient Instructions (Signed)
Medication Instructions:  Your physician recommends that you continue on your current medications as directed. Please refer to the Current Medication list given to you today.  If you need a refill on your cardiac medications before your next appointment, please call your pharmacy.   Lab work: Patient will get labs at PCP next month and will have results faxed to office @ [phone].   Testing/Procedures: None  Follow-Up:  Your physician wants you to follow-up in: 6 months with Dr. Prescott Parma will receive a reminder letter in the mail two months in advance. If you don't receive a letter, please call our office to schedule the follow-up appointment.  At [facility], you and your health needs are our priority.  As part of our continuing mission to provide you with exceptional heart care, we have created designated Provider Care Teams.  These Care Teams include your primary Cardiologist (physician) and Advanced Practice Providers (APPs -  Physician Assistants and Nurse Practitioners) who all work together to provide you with the care you need, when you need it. You will need a follow up appointment in:  6 months.  Please call our office 2 months in advance to schedule this appointment.  You may see Kristeen Miss, MD or one of the following Advanced Practice Providers on your designated Care Team: Tereso Newcomer, PA-C [place], [place] . Berton Bon, NP

## 2019-04-21 ENCOUNTER — Other Ambulatory Visit: Payer: Self-pay | Admitting: Cardiovascular Disease

## 2019-07-08 ENCOUNTER — Other Ambulatory Visit: Payer: Self-pay | Admitting: Cardiovascular Disease

## 2019-08-19 ENCOUNTER — Other Ambulatory Visit: Payer: Self-pay | Admitting: Cardiovascular Disease

## 2019-08-24 NOTE — Progress Notes (Signed)
Cardiology Office No   Date:  08/25/2019   ID:  Clinton Garza, DOB [birth year], MRN [id number]  PCP:  Clinton Fess, MD  Cardiologist:   Clinton Moores, MD    Chief Complaint  Patient presents with  . Coronary Artery Disease     Clinton Garza is a 83 y.o. male who presents for   1. Coronary artery disease-status post anterior wall myocardial infarction in 1996. He status post PTCA of the LAD 2. Hyperlipidemia   Clinton Garza is a 83 year old gentleman with a history of coronary artery disease. He status post anterior wall myocardial infarction in 1996. We performed PTCA of his left anterior descending artery. A stress Myoview study performed in 2000 it reveals anteroapical scar but no evidence of ischemia. His left ventricular systolic function was normal at 56%. An echocardiogram performed in May of 2001 reveals an ejection fraction between 55 and 60%. He has hypokinesis and calcification of the apex at the site of his old apical myocardial infarction.  He has gained some weight. No chest pain.   Oct. 3, 2013 - He has been busy - he bought a house at [facility] and has been remodeling the house. He has not been getting as much exercise as he would like.  Oct. 22, 2014   Clinton Garza is doing well. Still working on his [employer]. Clinton Garza.   He still eats some salt. He denies any angina.   He was placed on atorvastatin 80 mg a day. Unfortunately, he broke out with a drug rash several days after taking medication. He discontinued the Lipitor at that time.   April 24, 2014: Clinton Garza is doing well. No CP. Still eating some salt. Does have some DOe if he walks too fast.   Jan. 21, 2016:    Clinton Garza is doing well.  Has gained some weight - spine issues.  Still eats a little extra salt - Does not check his BP at home.   Jan. 23, 2017:  Clinton Garza is seen today for follow up of his CAD, HTN and hyperlipidemia. He has lost about 20 lbs over the past several months.  Has had  some vertigo symptoms.    Went to the ER , found to have hypertensive,    Was restarted back on HCTZ and potassium .  BP is well controlled this am No CP or dyspnea.   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   No CP or dyspnea.    Had back injections this past Monday - has helped his back pain .  Feb. 14, 2018:  No CP or dyspnea.  Having back pain . Has gained some weight .  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 [place] [facility] [place]    07/23/2017 Clinton Garza is seen today for follow-up visit. He's done well. He's not had any episodes of chest pain or shortness of breath. Has been gaining some weight.   He's not able to walk as much as he would like because of some back issues.  January 24, 2018:   Clinton Garza is seen today for follow up of his CAD, HTN and hyperlipidemia  Needs to exercise more,  Has back pain  Having dock issues at his [facility] )   July 20, 2018:  Clinton Garza is seen today for follow-up of his coronary artery disease and hyperlipidemia.  He has had some recent knee surgery and is walks with a cane.  Has knee swelling  Has wet macular degeneration. Doing well from a cardiac standpoint .  August 25, 2019: Clinton Garza is seen today for follow-up of his coronary artery disease and hyperlipidemia. Last Weight  Most recent update: 08/25/2019  8:18 AM   Weight  93.6 kg (206 lb 4 oz)           Is short of breath.   Has not been exercising at all due to lack of exercise (back surgery and knee surgery have limited his walking )  No CP.   We discussed at length   Past Medical History:  Diagnosis Date  . Coronary artery disease   . Dyslipidemia   . Hypertension   . MI, old    anterior wall  . Mixed hyperlipidemia 07/23/2017    Past Surgical History:  Procedure Laterality Date  . CARDIAC CATHETERIZATION  05/11/1995   normal left ventricular systolic function with ef of 70-75%  . EYE SURGERY       Current Outpatient  Medications  Medication Sig Dispense Refill  . aspirin 81 MG tablet Take 81 mg by mouth daily.      . hydrochlorothiazide (HYDRODIURIL) 25 MG tablet TAKE 1 TABLET BY MOUTH EVERY DAY 90 tablet 1  . Iodoquinol-HC (HYDROCORTISONE-IODOQUINOL) 1-1 % CREA Place 1 application onto the skin 2 (two) times daily as needed for rash.  1  . KLOR-CON M10 10 MEQ tablet TAKE 1 TABLET BY MOUTH EVERY DAY 90 tablet 1  . lisinopril (PRINIVIL,ZESTRIL) 20 MG tablet TAKE 1 TABLET BY MOUTH EVERY DAY 90 tablet 3  . Multiple Vitamins-Minerals (EYE VITAMINS) CAPS Take 1 capsule by mouth 2 (two) times daily.     . naproxen sodium (ALEVE) 220 MG tablet Take 440 mg by mouth every 12 (twelve) hours.    . nitroGLYCERIN (NITROSTAT) 0.4 MG SL tablet Place 1 tablet (0.4 mg total) under the tongue every 5 (five) minutes as needed. 25 tablet 3  . rosuvastatin (CRESTOR) 10 MG tablet TAKE 1 TABLET BY MOUTH EVERY DAY 90 tablet 3   No current facility-administered medications for this visit.     Allergies:   Lipitor [atorvastatin], Antivert [meclizine hcl], and Iodinated diagnostic agents    Social History:  The patient  reports that he has never smoked. He has never used smokeless tobacco. He reports that he does not drink alcohol or use drugs.   Family History:  The patient's family history includes Cancer in his mother; Heart Problems in his father.    ROS:  Please see the history of present illness.   Otherwise, review of systems are positive for none.   All other systems are reviewed and negative.   Physical Exam: Blood pressure 140/72, pulse 60, height 5\' 7"  (1.702 m), weight 206 lb 4 oz (93.6 kg).  GEN:  Well nourished, well developed in no acute distress HEENT: Normal NECK: No JVD; No carotid bruits LYMPHATICS: No lymphadenopathy CARDIAC: RRR  RESPIRATORY:  Clear to auscultation without rales, wheezing or rhonchi  ABDOMEN: Soft, non-tender, non-distended MUSCULOSKELETAL:  No edema; No deformity  SKIN: Warm and  dry NEUROLOGIC:  Alert and oriented x 3   EKG:   Oct. 30, 2020:   Sinus bradycardia at 56.   Previous ant. Lat MI   Recent Labs: No results found for requested labs within last 8760 hours.    Lipid Panel    Component Value Date/Time   CHOL 102 07/20/2018 0907   TRIG 77 07/20/2018 0907   HDL 35 (L)  07/20/2018 0907   CHOLHDL 2.9 07/20/2018 0907   CHOLHDL 3.6 09/15/2016 0738   VLDL 23 09/15/2016 0738   LDLCALC 52 07/20/2018 0907      Wt Readings from Last 3 Encounters:  08/25/19 206 lb 4 oz (93.6 kg)  02/15/19 195 lb (88.5 kg)  07/20/18 203 lb 2.1 oz (92.1 kg)      Other studies Reviewed: Additional studies/ records that were reviewed today include: . Review of the above records demonstrates:    ASSESSMENT AND PLAN:  1.   Coronary artery disease:  No angina.  He does have some shortness of breath with exertion but this may be due to generalized deconditioning.  He has had 2 surgeries over the past year and has not been able to exercise at all.  We will get an echocardiogram for further evaluation of his shortness of breath.  I would have a low threshold to proceed with further ischemic work-up if he has any degree of chest pain or any worsening of his symptoms.  2. Hypertension:    Blood pressure has been well controlled.  Continue current medications.  3. Hyperlipidemia:     We will check fasting lipids, liver enzymes, basic metabolic profile today.   Current medicines are reviewed at length with the patient today.  The patient does not have concerns regarding medicines.  The following changes have been made:  None   Labs/ tests ordered today include:   Orders Placed This Encounter  Procedures  . Lipid Profile  . Basic Metabolic Panel (BMET)  . Hepatic function panel  . EKG 12-Lead  . ECHOCARDIOGRAM COMPLETE     Disposition:      Signed, Kristeen Miss, MD  08/25/2019 8:31 AM    [facility] [address] [place], [place], [place]   [address] Phone: [phone]; Fax: [phone]

## 2019-08-25 ENCOUNTER — Encounter: Payer: Self-pay | Admitting: Cardiovascular Disease

## 2019-08-25 ENCOUNTER — Ambulatory Visit: Payer: Medicare Other | Admitting: Cardiovascular Disease

## 2019-08-25 ENCOUNTER — Other Ambulatory Visit: Payer: Self-pay

## 2019-08-25 VITALS — BP 140/72 | HR 60 | Ht 67.0 in | Wt 206.2 lb

## 2019-08-25 DIAGNOSIS — R0600 Dyspnea, unspecified: Secondary | ICD-10-CM

## 2019-08-25 DIAGNOSIS — R0609 Other forms of dyspnea: Secondary | ICD-10-CM

## 2019-08-25 DIAGNOSIS — E782 Mixed hyperlipidemia: Secondary | ICD-10-CM

## 2019-08-25 DIAGNOSIS — I10 Essential (primary) hypertension: Secondary | ICD-10-CM

## 2019-08-25 DIAGNOSIS — I2510 Atherosclerotic heart disease of native coronary artery without angina pectoris: Secondary | ICD-10-CM

## 2019-08-25 LAB — LIPID PANEL
Chol/HDL Ratio: 3.4 ratio (ref 0.0–5.0)
Cholesterol, Total: 108 mg/dL (ref 100–199)
HDL: 32 mg/dL — ABNORMAL LOW (ref >39)
LDL Chol Calc (NIH): 54 mg/dL (ref 0–99)
Triglycerides: 118 mg/dL (ref 0–149)
VLDL Cholesterol Cal: 22 mg/dL (ref 5–40)

## 2019-08-25 LAB — BASIC METABOLIC PANEL
BUN/Creatinine Ratio: 12 (ref 10–24)
BUN: 16 mg/dL (ref 8–27)
CO2: 21 mmol/L (ref 20–29)
Calcium: 9.1 mg/dL (ref 8.6–10.2)
Chloride: 102 mmol/L (ref 96–106)
Creatinine, Ser: 1.31 mg/dL — ABNORMAL HIGH (ref 0.76–1.27)
GFR calc Af Amer: 59 mL/min/{1.73_m2} — ABNORMAL LOW (ref >59)
GFR calc non Af Amer: 51 mL/min/{1.73_m2} — ABNORMAL LOW (ref >59)
Glucose: 103 mg/dL — ABNORMAL HIGH (ref 65–99)
Potassium: 4 mmol/L (ref 3.5–5.2)
Sodium: 140 mmol/L (ref 134–144)

## 2019-08-25 LAB — HEPATIC FUNCTION PANEL
ALT: 18 IU/L (ref 0–44)
AST: 27 IU/L (ref 0–40)
Albumin: 4.1 g/dL (ref 3.6–4.6)
Alkaline Phosphatase: 74 IU/L (ref 39–117)
Bilirubin Total: 0.6 mg/dL (ref 0.0–1.2)
Bilirubin, Direct: 0.21 mg/dL (ref 0.00–0.40)
Total Protein: 6.3 g/dL (ref 6.0–8.5)

## 2019-08-25 NOTE — Patient Instructions (Addendum)
  Medication Instructions:  Your physician recommends that you continue on your current medications as directed. Please refer to the Current Medication list given to you today.  *If you need a refill on your cardiac medications before your next appointment, please call your pharmacy*   Lab Work: TODAY - cholesterol, liver panel, basic metabolic panel If you have labs (blood work) drawn today and your tests are completely normal, you will receive your results only by: Marland Kitchen MyChart Message (if you have MyChart) OR . A paper copy in the mail If you have any lab test that is abnormal or we need to change your treatment, we will call you to review the results.   Testing/Procedures: Your physician has requested that you have an echocardiogram. Echocardiography is a painless test that uses sound waves to create images of your heart. It provides your doctor with information about the size and shape of your heart and how well your heart's chambers and valves are working. This procedure takes approximately one hour. There are no restrictions for this procedure.    Follow-Up: At [facility], you and your health needs are our priority.  As part of our continuing mission to provide you with exceptional heart care, we have created designated Provider Care Teams.  These Care Teams include your primary Cardiologist (physician) and Advanced Practice Providers (APPs -  Physician Assistants and Nurse Practitioners) who all work together to provide you with the care you need, when you need it.  Your next appointment:   12 months  The format for your next appointment:   In Person  Provider:   You may see Mertie Moores, MD or one of the following Advanced Practice Providers on your designated Care Team:    Richardson Dopp, PA-C  [place], [place]  Daune Perch, [place]

## 2019-09-03 ENCOUNTER — Other Ambulatory Visit: Payer: Self-pay | Admitting: Cardiovascular Disease

## 2019-09-05 ENCOUNTER — Other Ambulatory Visit: Payer: Self-pay

## 2019-09-05 ENCOUNTER — Ambulatory Visit (HOSPITAL_COMMUNITY): Payer: Medicare Other | Attending: Internal Medicine

## 2019-09-05 ENCOUNTER — Telehealth: Payer: Self-pay | Admitting: Cardiovascular Disease

## 2019-09-05 DIAGNOSIS — R0600 Dyspnea, unspecified: Secondary | ICD-10-CM | POA: Insufficient documentation

## 2019-09-05 DIAGNOSIS — I2510 Atherosclerotic heart disease of native coronary artery without angina pectoris: Secondary | ICD-10-CM

## 2019-09-05 DIAGNOSIS — E782 Mixed hyperlipidemia: Secondary | ICD-10-CM | POA: Diagnosis not present

## 2019-09-05 DIAGNOSIS — I10 Essential (primary) hypertension: Secondary | ICD-10-CM

## 2019-09-05 DIAGNOSIS — R0609 Other forms of dyspnea: Secondary | ICD-10-CM

## 2019-09-05 MED ORDER — PERFLUTREN LIPID MICROSPHERE
1.0000 mL | INTRAVENOUS | Status: AC | PRN
  Administered 2019-09-05: 2 mL via INTRAVENOUS

## 2019-09-05 NOTE — Telephone Encounter (Signed)
Echo results reviewed with patient and all questions were answered. Patient states he has been trying to walk more and denies chest discomfort. I advised him to call back with questions or concerns prior to his next visit and he thanked me for the call.

## 2019-09-05 NOTE — Telephone Encounter (Signed)
Patient returning call in regards to echo results.  

## 2019-11-04 ENCOUNTER — Other Ambulatory Visit: Payer: Self-pay | Admitting: Cardiovascular Disease

## 2019-12-26 ENCOUNTER — Other Ambulatory Visit: Payer: Self-pay

## 2019-12-26 MED ORDER — NITROGLYCERIN 0.4 MG SL SUBL: 0.4000 mg | SUBLINGUAL_TABLET | SUBLINGUAL | 1 refills | Status: DC | PRN

## 2020-03-05 ENCOUNTER — Other Ambulatory Visit: Payer: Self-pay | Admitting: Cardiovascular Disease

## 2020-04-03 ENCOUNTER — Other Ambulatory Visit: Payer: Self-pay | Admitting: Cardiovascular Disease

## 2020-04-08 ENCOUNTER — Other Ambulatory Visit: Payer: Self-pay | Admitting: Cardiovascular Disease

## 2020-05-13 ENCOUNTER — Other Ambulatory Visit: Payer: Self-pay | Admitting: Cardiovascular Disease

## 2020-07-06 ENCOUNTER — Other Ambulatory Visit: Payer: Self-pay | Admitting: Cardiovascular Disease

## 2020-07-31 ENCOUNTER — Other Ambulatory Visit: Payer: Self-pay | Admitting: Cardiovascular Disease

## 2020-08-28 ENCOUNTER — Encounter: Payer: Self-pay | Admitting: Cardiovascular Disease

## 2020-08-28 NOTE — Progress Notes (Signed)
Cardiology Office No   Date:  08/30/2020   ID:  Clinton Garza, DOB 1936-02-12, MRN [id number]  PCP:  Levy Sjogren, NP  Cardiologist:   Kristeen Miss, MD    Chief Complaint  Patient presents with  . Coronary Artery Disease  . Hyperlipidemia     Clinton Garza is a 84 y.o. male who presents for   1. Coronary artery disease-status post anterior wall myocardial infarction in 1996. He status post PTCA of the LAD 2. Hyperlipidemia   Clinton Garza is a 84 year old gentleman with a history of coronary artery disease. He status post anterior wall myocardial infarction in 1996. We performed PTCA of his left anterior descending artery. A stress Myoview study performed in 2000 it reveals anteroapical scar but no evidence of ischemia. His left ventricular systolic function was normal at 56%. An echocardiogram performed in May of 2001 reveals an ejection fraction between 55 and 60%. He has hypokinesis and calcification of the apex at the site of his old apical myocardial infarction.  He has gained some weight. No chest pain.   Oct. 3, 2013 - He has been busy - he bought a house at [facility] and has been remodeling the house. He has not been getting as much exercise as he would like.  Oct. 22, 2014   Clinton Garza is doing well. Still working on his [employer]   He still eats some salt. He denies any angina.   He was placed on atorvastatin 80 mg a day. Unfortunately, he broke out with a drug rash several days after taking medication. He discontinued the Lipitor at that time.   April 24, 2014: Clinton Garza is doing well. No CP. Still eating some salt. Does have some DOe if he walks too fast.   Jan. 21, 2016:    Clinton Garza is doing well.  Has gained some weight - spine issues.  Still eats a little extra salt - Does not check his BP at home.   Jan. 23, 2017:  Clinton Garza is seen today for follow up of his CAD, HTN and hyperlipidemia. He has lost about 20 lbs over the past  several months.  Has had some vertigo symptoms.    Went to the ER , found to have hypertensive,    Was restarted back on HCTZ and potassium .  BP is well controlled this am No CP or dyspnea.   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   No CP or dyspnea.    Had back injections this past Monday - has helped his back pain .  Feb. 14, 2018:  No CP or dyspnea.  Having back pain . Has gained some weight .  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 [place] to [facility] [place]    07/23/2017 Clinton Garza is seen today for follow-up visit. He's done well. He's not had any episodes of chest pain or shortness of breath. Has been gaining some weight.   He's not able to walk as much as he would like because of some back issues.  January 24, 2018:   Clinton Garza is seen today for follow up of his CAD, HTN and hyperlipidemia  Needs to exercise more,  Has back pain  [place] [facility] [place]   July 20, 2018:  Clinton Garza is seen today for follow-up of his coronary artery disease and hyperlipidemia.  He has had some recent knee surgery and is walks  with a cane. Has knee swelling  Has wet macular degeneration. Doing well from a cardiac standpoint .  August 25, 2019: Clinton Garza is seen today for follow-up of his coronary artery disease and hyperlipidemia. Last Weight  Most recent update: 08/25/2019  8:18 AM   Weight  93.6 kg (206 lb 4 oz)           Is short of breath.   Has not been exercising at all due to lack of exercise (back surgery and knee surgery have limited his walking )  No CP.   We discussed at length  Nov. 4, 2021;  Clinton Garza is seen today for follow up of his CAD and HLD. Had back surgery .   Still having lots of pain .  Due to have another back injection .     Past Medical History:  Diagnosis Date  . Coronary artery disease   . Dyslipidemia   . Hypertension   . MI, old    anterior wall  . Mixed hyperlipidemia 07/23/2017    Past  Surgical History:  Procedure Laterality Date  . CARDIAC CATHETERIZATION  05/11/1995   normal left ventricular systolic function with ef of 70-75%  . EYE SURGERY       Current Outpatient Medications  Medication Sig Dispense Refill  . aspirin 81 MG tablet Take 81 mg by mouth daily.      . hydrochlorothiazide (HYDRODIURIL) 25 MG tablet TAKE 1 TABLET BY MOUTH EVERY DAY 90 tablet 1  . Iodoquinol-HC (HYDROCORTISONE-IODOQUINOL) 1-1 % CREA Place 1 application onto the skin 2 (two) times daily as needed for rash.  1  . KLOR-CON M10 10 MEQ tablet TAKE 1 TABLET BY MOUTH EVERY DAY 90 tablet 1  . lisinopril (ZESTRIL) 20 MG tablet TAKE 1 TABLET BY MOUTH EVERY DAY 90 tablet 0  . meloxicam (MOBIC) 7.5 MG tablet Take 7.5-15 mg by mouth daily as needed.    . Multiple Vitamins-Minerals (EYE VITAMINS) CAPS Take 1 capsule by mouth 2 (two) times daily.     . naproxen sodium (ALEVE) 220 MG tablet Take 440 mg by mouth every 12 (twelve) hours.    . nitroGLYCERIN (NITROSTAT) 0.4 MG SL tablet Place 1 tablet (0.4 mg total) under the tongue every 5 (five) minutes as needed. 25 tablet 1  . rosuvastatin (CRESTOR) 10 MG tablet TAKE 1 TABLET BY MOUTH EVERY DAY 90 tablet 0   No current facility-administered medications for this visit.    Allergies:   Lipitor [atorvastatin], Antivert [meclizine hcl], and Iodinated diagnostic agents    Social History:  The patient  reports that he has never smoked. He has never used smokeless tobacco. He reports that he does not drink alcohol and does not use drugs.   Family History:  The patient's family history includes Cancer in his mother; Heart Problems in his father.    ROS:  Please see the history of present illness.   Otherwise, review of systems are positive for none.   All other systems are reviewed and negative.    Physical Exam: Blood pressure 118/70, pulse (!) 54, height 5\' 7"  (1.702 m), weight 200 lb 12.8 oz (91.1 kg), SpO2 97 %.  GEN:  Well nourished, well developed  in no acute distress HEENT: Normal NECK: No JVD; No carotid bruits LYMPHATICS: No lymphadenopathy CARDIAC: RRR , no murmurs, rubs, gallops RESPIRATORY:  Clear to auscultation without rales, wheezing or rhonchi  ABDOMEN: Soft, non-tender, non-distended MUSCULOSKELETAL:  No edema; No deformity  SKIN: Warm and dry  NEUROLOGIC:  Alert and oriented x 3    EKG:     Sinus brady at 54.  Poor R wave progression.  Old ant. Mi . No changes from previous ECG    Recent Labs: 08/29/2020: ALT 17; BUN 15; Creatinine, Ser 1.27; Potassium 4.6; Sodium 140    Lipid Panel    Component Value Date/Time   CHOL 120 08/29/2020 1007   TRIG 140 08/29/2020 1007   HDL 35 (L) 08/29/2020 1007   CHOLHDL 3.4 08/29/2020 1007   CHOLHDL 3.6 09/15/2016 0738   VLDL 23 09/15/2016 0738   LDLCALC 60 08/29/2020 1007      Wt Readings from Last 3 Encounters:  08/29/20 200 lb 12.8 oz (91.1 kg)  08/25/19 206 lb 4 oz (93.6 kg)  02/15/19 195 lb (88.5 kg)      Other studies Reviewed: Additional studies/ records that were reviewed today include: . Review of the above records demonstrates:    ASSESSMENT AND PLAN:  1.   Coronary artery disease:    No angina .     2. Hypertension:     BP is well controlled.   3. Hyperlipidemia:    Cont crestor.   Check labs today   I'll see him in a year.    Current medicines are reviewed at length with the patient today.  The patient does not have concerns regarding medicines.  The following changes have been made:  None   Labs/ tests ordered today include:   Orders Placed This Encounter  Procedures  . Lipid Profile  . Basic Metabolic Panel (BMET)  . Hepatic function panel  . EKG 12-Lead     Disposition:      Signed, Kristeen Miss, MD  08/30/2020 5:52 PM    [facility] [address] [place], [place], [place]  [address] Phone: [phone]; Fax: [phone]

## 2020-08-29 ENCOUNTER — Ambulatory Visit: Payer: Medicare Other | Admitting: Cardiovascular Disease

## 2020-08-29 ENCOUNTER — Other Ambulatory Visit: Payer: Self-pay

## 2020-08-29 ENCOUNTER — Encounter: Payer: Self-pay | Admitting: Cardiovascular Disease

## 2020-08-29 VITALS — BP 118/70 | HR 54 | Ht 67.0 in | Wt 200.8 lb

## 2020-08-29 DIAGNOSIS — I2510 Atherosclerotic heart disease of native coronary artery without angina pectoris: Secondary | ICD-10-CM | POA: Diagnosis not present

## 2020-08-29 DIAGNOSIS — E782 Mixed hyperlipidemia: Secondary | ICD-10-CM | POA: Diagnosis not present

## 2020-08-29 DIAGNOSIS — I10 Essential (primary) hypertension: Secondary | ICD-10-CM

## 2020-08-29 DIAGNOSIS — N1831 Chronic kidney disease, stage 3a: Secondary | ICD-10-CM

## 2020-08-29 LAB — HEPATIC FUNCTION PANEL
ALT: 17 IU/L (ref 0–44)
AST: 28 IU/L (ref 0–40)
Albumin: 4.3 g/dL (ref 3.6–4.6)
Alkaline Phosphatase: 72 IU/L (ref 44–121)
Bilirubin Total: 0.7 mg/dL (ref 0.0–1.2)
Bilirubin, Direct: 0.21 mg/dL (ref 0.00–0.40)
Total Protein: 6.4 g/dL (ref 6.0–8.5)

## 2020-08-29 LAB — LIPID PANEL
Chol/HDL Ratio: 3.4 ratio (ref 0.0–5.0)
Cholesterol, Total: 120 mg/dL (ref 100–199)
HDL: 35 mg/dL — ABNORMAL LOW (ref >39)
LDL Chol Calc (NIH): 60 mg/dL (ref 0–99)
Triglycerides: 140 mg/dL (ref 0–149)
VLDL Cholesterol Cal: 25 mg/dL (ref 5–40)

## 2020-08-29 LAB — BASIC METABOLIC PANEL
BUN/Creatinine Ratio: 12 (ref 10–24)
BUN: 15 mg/dL (ref 8–27)
CO2: 27 mmol/L (ref 20–29)
Calcium: 9.7 mg/dL (ref 8.6–10.2)
Chloride: 100 mmol/L (ref 96–106)
Creatinine, Ser: 1.27 mg/dL (ref 0.76–1.27)
GFR calc Af Amer: 60 mL/min/{1.73_m2} (ref >59)
GFR calc non Af Amer: 52 mL/min/{1.73_m2} — ABNORMAL LOW (ref >59)
Glucose: 113 mg/dL — ABNORMAL HIGH (ref 65–99)
Potassium: 4.6 mmol/L (ref 3.5–5.2)
Sodium: 140 mmol/L (ref 134–144)

## 2020-08-29 NOTE — Patient Instructions (Signed)
Medication Instructions:  Your physician recommends that you continue on your current medications as directed. Please refer to the Current Medication list given to you today.  *If you need a refill on your cardiac medications before your next appointment, please call your pharmacy*   Lab Work: TODAY - cholesterol, liver panel, basic metabolic panel If you have labs (blood work) drawn today and your tests are completely normal, you will receive your results only by: MyChart Message (if you have MyChart) OR A paper copy in the mail If you have any lab test that is abnormal or we need to change your treatment, we will call you to review the results.   Testing/Procedures: None Ordered   Follow-Up: At [facility], you and your health needs are our priority.  As part of our continuing mission to provide you with exceptional heart care, we have created designated Provider Care Teams.  These Care Teams include your primary Cardiologist (physician) and Advanced Practice Providers (APPs -  Physician Assistants and Nurse Practitioners) who all work together to provide you with the care you need, when you need it.  Your next appointment:   1 year(s)  The format for your next appointment:   In Person  Provider:   You may see Philip Nahser, MD or one of the following Advanced Practice Providers on your designated Care Team:   Scott Weaver, PA-C Vin Bhagat, PA-C   

## 2020-09-16 ENCOUNTER — Other Ambulatory Visit: Payer: Self-pay | Admitting: Cardiovascular Disease

## 2020-10-15 ENCOUNTER — Other Ambulatory Visit: Payer: Self-pay | Admitting: Cardiovascular Disease

## 2020-12-11 ENCOUNTER — Telehealth: Payer: Self-pay

## 2020-12-11 NOTE — Telephone Encounter (Signed)
   [facility] Pre-operative Risk Assessment    HEARTCARE STAFF: - Please ensure there is not already an duplicate clearance open for this procedure. - Under Visit Info/Reason for Call, type in Other and utilize the format Clearance MM/DD/YY or Clearance TBD. Do not use dashes or single digits. - If request is for dental extraction, please clarify the # of teeth to be extracted.  Request for surgical clearance:  1. What type of surgery is being performed? C5-6 Anterior cervical decompression/discectomy/fusion   2. When is this surgery scheduled? TBD   3. What type of clearance is required (medical clearance vs. Pharmacy clearance to hold med vs. Both)? Medical  4. Are there any medications that need to be held prior to surgery and how long? ASA   5. Practice name and name of physician performing surgery? Kristeen Miss, MD--[facility]   6. What is the office phone number? [phone]   7.   What is the office fax number? [phone] Attn: Janett Billow  8.   Anesthesia type (None, local, MAC, general) ? General   Clinton Garza 12/11/2020, 3:02 PM  _________________________________________________________________   (provider comments below)

## 2020-12-13 NOTE — Telephone Encounter (Signed)
   Primary Cardiologist: Kristeen Miss, MD  Chart reviewed as part of pre-operative protocol coverage.Mr.  Clinton Garza was last seen 08/29/20 by Dr. Elease Hashimoto and was doing well from a cardiac perspective. No changes were made and he was recommended to follow up in 1 year. He has a hx of CAD s/p anterior wall MI 2996 with PTCA of LAD, stress myoview in 2000 with anteroapical scar but no evidence of ischemia.   Will route to his primary cardiologist for recommendations regarding holding aspirin prior to planned procedure.   Alver Sorrow, NP 12/13/2020, 1:54 PM

## 2020-12-13 NOTE — Telephone Encounter (Signed)
Mccauley is at low risk for his upcoming back surgery . He may hold his ASA for 5-7 days prior to his surgery .

## 2020-12-16 NOTE — Telephone Encounter (Signed)
   Primary Cardiologist: Kristeen Miss, MD  Chart reviewed as part of pre-operative protocol coverage. Given past medical history and time since last visit, based on ACC/AHA guidelines, Clinton Garza would be at acceptable risk for the planned procedure without further cardiovascular testing.   Per his primary cardiologist he may hold Aspirin 5-7 days prior to the planned procedure at the direction of Dr. Danielle Dess. Please resume as soon as safe from surgical perspective.   The patient was advised that if he develops new symptoms prior to surgery to contact our office to arrange for a follow-up visit, and he verbalized understanding.  I will route this recommendation to the requesting party via Epic fax function and remove from pre-op pool.  Please call with questions.  Alver Sorrow, NP 12/16/2020, 8:58 AM

## 2021-07-11 ENCOUNTER — Other Ambulatory Visit: Payer: Self-pay | Admitting: Cardiovascular Disease

## 2021-07-30 ENCOUNTER — Other Ambulatory Visit: Payer: Self-pay | Admitting: Cardiovascular Disease

## 2021-08-26 ENCOUNTER — Other Ambulatory Visit: Payer: Self-pay

## 2021-08-26 ENCOUNTER — Encounter: Payer: Self-pay | Admitting: Cardiovascular Disease

## 2021-08-26 ENCOUNTER — Ambulatory Visit: Payer: Medicare Other | Admitting: Cardiovascular Disease

## 2021-08-26 VITALS — BP 130/72 | HR 67 | Ht 67.0 in | Wt 204.6 lb

## 2021-08-26 DIAGNOSIS — I10 Essential (primary) hypertension: Secondary | ICD-10-CM | POA: Diagnosis not present

## 2021-08-26 DIAGNOSIS — I2510 Atherosclerotic heart disease of native coronary artery without angina pectoris: Secondary | ICD-10-CM | POA: Diagnosis not present

## 2021-08-26 DIAGNOSIS — E782 Mixed hyperlipidemia: Secondary | ICD-10-CM

## 2021-08-26 LAB — BASIC METABOLIC PANEL
BUN/Creatinine Ratio: 11 (ref 10–24)
BUN: 14 mg/dL (ref 8–27)
CO2: 25 mmol/L (ref 20–29)
Calcium: 9.3 mg/dL (ref 8.6–10.2)
Chloride: 100 mmol/L (ref 96–106)
Creatinine, Ser: 1.23 mg/dL (ref 0.76–1.27)
Glucose: 115 mg/dL — ABNORMAL HIGH (ref 70–99)
Potassium: 4.1 mmol/L (ref 3.5–5.2)
Sodium: 138 mmol/L (ref 134–144)
eGFR: 58 mL/min/{1.73_m2} — ABNORMAL LOW (ref >59)

## 2021-08-26 LAB — LIPID PANEL
Chol/HDL Ratio: 4 ratio (ref 0.0–5.0)
Cholesterol, Total: 115 mg/dL (ref 100–199)
HDL: 29 mg/dL — ABNORMAL LOW (ref >39)
LDL Chol Calc (NIH): 55 mg/dL (ref 0–99)
Triglycerides: 189 mg/dL — ABNORMAL HIGH (ref 0–149)
VLDL Cholesterol Cal: 31 mg/dL (ref 5–40)

## 2021-08-26 LAB — ALT: ALT: 18 IU/L (ref 0–44)

## 2021-08-26 NOTE — Patient Instructions (Signed)
Medication Instructions:  Your physician recommends that you continue on your current medications as directed. Please refer to the Current Medication list given to you today.  *If you need a refill on your cardiac medications before your next appointment, please call your pharmacy*   Lab Work: Lipid, ALT and BMET today  If you have labs (blood work) drawn today and your tests are completely normal, you will receive your results only by: MyChart Message (if you have MyChart) OR A paper copy in the mail If you have any lab test that is abnormal or we need to change your treatment, we will call you to review the results.   Testing/Procedures: None   Follow-Up: At [facility], you and your health needs are our priority.  As part of our continuing mission to provide you with exceptional heart care, we have created designated Provider Care Teams.  These Care Teams include your primary Cardiologist (physician) and Advanced Practice Providers (APPs -  Physician Assistants and Nurse Practitioners) who all work together to provide you with the care you need, when you need it.  We recommend signing up for the patient portal called "MyChart".  Sign up information is provided on this After Visit Summary.  MyChart is used to connect with patients for Virtual Visits (Telemedicine).  Patients are able to view lab/test results, encounter notes, upcoming appointments, etc.  Non-urgent messages can be sent to your provider as well.   To learn more about what you can do with MyChart, go to [email or web address].    Your next appointment:   1 year(s)  The format for your next appointment:   In Person  Provider:   You may see Philip Nahser, MD or one of the following Advanced Practice Providers on your designated Care Team:   Scott Weaver, PA-C Vin Bhagat, PA-C   Other Instructions   

## 2021-08-26 NOTE — Progress Notes (Signed)
Cardiology Office No   Date:  08/26/2021   ID:  Clinton Garza, DOB [birth year], MRN [id number]  PCP:  Levy Sjogren, NP  Cardiologist:   Kristeen Miss, MD    No chief complaint on file.    Clinton Garza is a 85 y.o. male who presents for   1. Coronary artery disease-status post anterior wall myocardial infarction in 1996. He status post PTCA of the LAD 2. Hyperlipidemia   Clinton Garza is a 85 year old gentleman with a history of coronary artery disease. He status post anterior wall myocardial infarction in 1996. We performed PTCA of his left anterior descending artery.  A stress Myoview study performed in 2000 it reveals anteroapical scar but no evidence of ischemia. His left ventricular systolic function was normal at 56%. An echocardiogram performed in May of 2001 reveals an ejection fraction between 55 and 60%. He has hypokinesis and calcification of the apex at the site of his old apical myocardial infarction.  He has gained some weight.  No chest pain.   Oct. 3, 2013 - He has been busy - he bought a house at [facility] and has been remodeling the house.  He has not been getting as much exercise as he would like.  Oct. 22, 2014    [name] is doing well.  Still working on his [employer].    He still eats some salt.   He denies any angina.   He was placed on atorvastatin 80 mg a day. Unfortunately, he broke out with a drug rash several days after taking medication. He discontinued the Lipitor at  that time.    April 24, 2014: Clinton Garza is doing well.  No CP.  Still eating some salt.  Does have some DOe if he walks too fast.    Jan. 21, 2016:    Clinton Garza is doing well.  Has gained some weight - spine issues.  Still eats a little extra salt - Does not check his BP at home.   Jan. 23, 2017:  Clinton Garza is seen today for follow up of his CAD, HTN and hyperlipidemia. He has lost about 20 lbs over the past several months.  Has had some vertigo symptoms.    Went to the  ER , found to have hypertensive,    Was restarted back on HCTZ and potassium .  BP is well controlled this am No CP or dyspnea.   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   No CP or dyspnea.    Had back injections this past Monday - has helped his back pain .  Feb. 14, 2018:  No CP or dyspnea.  Having back pain . Has gained some weight .  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 He's done well. He's not had any episodes of chest pain or shortness of breath. Has been gaining some weight.   He's not able to walk as much as he would like because of some back issues.  January 24, 2018:   Clinton Garza is seen today for follow up of his CAD, HTN and hyperlipidemia  Needs to exercise more,  Has back pain  [place] [facility] )   July 20, 2018:  Clinton Garza is seen today for follow-up of his coronary artery disease and hyperlipidemia.  He has had some recent knee  surgery and is walks with a cane. Has knee swelling  Has wet macular degeneration. Doing well from a cardiac standpoint .  August 25, 2019: Clinton Garza is seen today for follow-up of his coronary artery disease and hyperlipidemia. Last Weight  Most recent update: 08/25/2019  8:18 AM    Weight  93.6 kg (206 lb 4 oz)            Is short of breath.   Has not been exercising at all due to lack of exercise (back surgery and knee surgery have limited his walking )  No CP.   We discussed at length  Nov. 4, 2021;  Clinton Garza is seen today for follow up of his CAD and HLD. Had back surgery .   Still having lots of pain .  Due to have another back injection .    August 26, 2021: Clinton Garza is seen for follow-up of his coronary artery disease, hyperlipidemia. Still having leg and back pain .  Had a squamous cell cancer removed  No CP  Has fully retired.     Past Medical History:  Diagnosis Date    Coronary artery disease    Dyslipidemia    Hypertension    MI, old    anterior wall   Mixed hyperlipidemia 07/23/2017    Past Surgical History:  Procedure Laterality Date   CARDIAC CATHETERIZATION  05/11/1995   normal left ventricular systolic function with ef of 70-75%   EYE SURGERY       Current Outpatient Medications  Medication Sig Dispense Refill   aspirin 81 MG tablet Take 81 mg by mouth daily.       hydrochlorothiazide (HYDRODIURIL) 25 MG tablet TAKE 1 TABLET BY MOUTH EVERY DAY 90 tablet 0   Iodoquinol-HC (HYDROCORTISONE-IODOQUINOL) 1-1 % CREA Place 1 application onto the skin 2 (two) times daily as needed for rash.  1   KLOR-CON M10 10 MEQ tablet TAKE 1 TABLET BY MOUTH EVERY DAY 90 tablet 0   lisinopril (ZESTRIL) 20 MG tablet TAKE 1 TABLET BY MOUTH EVERY DAY 90 tablet 0   meloxicam (MOBIC) 7.5 MG tablet Take 7.5-15 mg by mouth daily as needed.     Multiple Vitamins-Minerals (EYE VITAMINS) CAPS Take 1 capsule by mouth 2 (two) times daily.      naproxen sodium (ALEVE) 220 MG tablet Take 440 mg by mouth every 12 (twelve) hours.     nitroGLYCERIN (NITROSTAT) 0.4 MG SL tablet Place 1 tablet (0.4 mg total) under the tongue every 5 (five) minutes as needed. 25 tablet 1   rosuvastatin (CRESTOR) 10 MG tablet TAKE 1 TABLET BY MOUTH EVERY DAY 90 tablet 0   No current facility-administered medications for this visit.    Allergies:   Lipitor [atorvastatin], Antivert [meclizine hcl], and Iodinated diagnostic agents    Social History:  The patient  reports that he has never smoked. He has never used smokeless tobacco. He reports that he does not drink alcohol and does not use drugs.   Family History:  The patient's family history includes Cancer in his mother; Heart Problems in his father.    ROS:  Please see the history of present illness.   Otherwise, review of systems are positive for none.   All other systems are reviewed and negative.    Physical Exam: Blood pressure 130/72,  pulse 67, height 5\' 7"  (1.702 m), weight 204 lb 9.6 oz (92.8 kg), SpO2 96 %.  GEN:  Well nourished, well developed in no acute distress HEENT:  Normal NECK: No JVD; No carotid bruits LYMPHATICS: No lymphadenopathy CARDIAC: RRR , no murmurs, rubs, gallops RESPIRATORY:  Clear to auscultation without rales, wheezing or rhonchi  ABDOMEN: Soft, non-tender, non-distended MUSCULOSKELETAL:  No edema; No deformity  SKIN: Warm and dry NEUROLOGIC:  Alert and oriented x 3   EKG:     August 26, 2021: Normal sinus rhythm at 67.  Previous anterior wall myocardial infarction.  Rare premature ventricular contraction.   Recent Labs: 08/29/2020: ALT 17; BUN 15; Creatinine, Ser 1.27; Potassium 4.6; Sodium 140    Lipid Panel    Component Value Date/Time   CHOL 120 08/29/2020 1007   TRIG 140 08/29/2020 1007   HDL 35 (L) 08/29/2020 1007   CHOLHDL 3.4 08/29/2020 1007   CHOLHDL 3.6 09/15/2016 0738   VLDL 23 09/15/2016 0738   LDLCALC 60 08/29/2020 1007      Wt Readings from Last 3 Encounters:  08/26/21 204 lb 9.6 oz (92.8 kg)  08/29/20 200 lb 12.8 oz (91.1 kg)  08/25/19 206 lb 4 oz (93.6 kg)      Other studies Reviewed: Additional studies/ records that were reviewed today include: . Review of the above records demonstrates:    ASSESSMENT AND PLAN:  1.   Coronary artery disease:     Damyen seems to be doing well from a cardiac standpoint.  Has not had any episodes of chest pain or shortness of breath.  He is not getting much exercise-mainly limited by his back and leg pain.  He he has significant back issues.  2. Hypertension:     BP is well controlled  3. Hyperlipidemia:     Lipid levels have been well controlled.  We will check lipids today.    I will see him again in 1 year.  Current medicines are reviewed at length with the patient today.  The patient does not have concerns regarding medicines.  The following changes have been made:  None   Labs/ tests ordered today include:    No orders of the defined types were placed in this encounter.    Disposition:      Signed, Kristeen Miss, MD  08/26/2021 10:14 AM    [facility] [address] [place], [place], [place]  [address] Phone: [phone]; Fax: [phone]

## 2021-10-06 ENCOUNTER — Other Ambulatory Visit: Payer: Self-pay | Admitting: Cardiovascular Disease

## 2021-10-25 ENCOUNTER — Other Ambulatory Visit: Payer: Self-pay | Admitting: Cardiovascular Disease

## 2021-11-02 ENCOUNTER — Other Ambulatory Visit: Payer: Self-pay | Admitting: Cardiovascular Disease

## 2021-11-11 ENCOUNTER — Other Ambulatory Visit: Payer: Self-pay | Admitting: Neurological Surgery

## 2021-11-14 ENCOUNTER — Encounter (HOSPITAL_COMMUNITY): Payer: Self-pay | Admitting: Neurological Surgery

## 2021-11-14 ENCOUNTER — Other Ambulatory Visit: Payer: Self-pay

## 2021-11-14 NOTE — Anesthesia Preprocedure Evaluation (Addendum)
Anesthesia Evaluation  Patient identified by MRN, date of birth, ID band Patient awake    Reviewed: Allergy & Precautions, NPO status   Airway Mallampati: II  TM Distance: >3 FB     Dental   Pulmonary    breath sounds clear to auscultation       Cardiovascular hypertension, + Past MI   Rhythm:Regular Rate:Normal     Neuro/Psych negative neurological ROS     GI/Hepatic Neg liver ROS, GERD  ,  Endo/Other  negative endocrine ROS  Renal/GU negative Renal ROS     Musculoskeletal  (+) Arthritis ,   Abdominal   Peds  Hematology   Anesthesia Other Findings   Reproductive/Obstetrics                            Anesthesia Physical Anesthesia Plan  ASA: 3  Anesthesia Plan: General   Post-op Pain Management:    Induction: Intravenous  PONV Risk Score and Plan: 3 and Ondansetron, Dexamethasone and Midazolam  Airway Management Planned: Oral ETT  Additional Equipment:   Intra-op Plan:   Post-operative Plan: Possible Post-op intubation/ventilation  Informed Consent: I have reviewed the patients History and Physical, chart, labs and discussed the procedure including the risks, benefits and alternatives for the proposed anesthesia with the patient or authorized representative who has indicated his/her understanding and acceptance.     Dental advisory given  Plan Discussed with: CRNA and Anesthesiologist  Anesthesia Plan Comments: (PAT note written 11/14/2021 by Shonna Chock, PA-C. )       Anesthesia Quick Evaluation

## 2021-11-14 NOTE — Progress Notes (Signed)
DUE TO COVID-19 ONLY ONE VISITOR IS ALLOWED TO COME WITH YOU AND STAY IN THE WAITING ROOM ONLY DURING PRE OP AND PROCEDURE DAY OF SURGERY.   Two VISITORS MAY VISIT WITH YOU AFTER SURGERY IN YOUR PRIVATE ROOM DURING VISITING HOURS ONLY!  PCP - Dr Larwance Sachs Cardiologist - Dr Laqueta Carina  Chest x-ray - n/a EKG - 08/26/21 Stress Test - 08/15/07 ECHO - 09/05/19 Cardiac Cath - n/a  ICD Pacemaker/Loop - n/a  Sleep Study -  n/a CPAP - none  ERAS: Clear liquids til 4:30 DOS  Anesthesia review: Yes  STOP now taking any Aspirin (unless otherwise instructed by your surgeon), Aleve, Naproxen, Ibuprofen, Motrin, Advil, Goody's, BC's, all herbal medications, fish oil, and all vitamins.   Coronavirus Screening Covid test is scheduled on DOS. Do you have any of the following symptoms Per Wife Pat:  Cough Yes Fever (>100.30F)  yes/no: No Runny nose yes/no: No Sore throat yes/no: No Difficulty breathing/shortness of breath  yes/no: No  Have you traveled in the last 14 days and where? yes/no: No  Patient/Wife Pat verbalized understanding of instructions that were given via phone

## 2021-11-14 NOTE — Progress Notes (Signed)
Anesthesia Chart Review:  Case: [id number] Date/Time: 11/17/21 0715   Procedure: L4-5 PLIF (Back) - 3C/RM 21   Anesthesia type: General   Pre-op diagnosis: Lumbosacral radiculopathy due to osteoarthritis of spine   Location: [facility] OR ROOM 21 / [place] [facility]   Surgeons: Kristeen Miss, MD       DISCUSSION: Patient is an 86 year old male scheduled for the above procedure.  History includes CAD (anterior MI 1996, s/p PTCA LAD), spinal surgery (back surgery 2021; C5-6 ACDF 12/2020), COVId-19 (02/07/21).  Last cardiology visit 08/26/2021 with Dr. Acie Fredrickson. He was doing well from a cardiac standpoint. Noted some limitations from back pain. One year follow-up planned. He was previously cleared for C5-6 ACDF in 11/2020, appears to have been done in 12/2020.   He is a same-day work-up.  Labs as indicated on arrival.  Anesthesia team evaluation on the day of surgery.   VS:  BP Readings from Last 3 Encounters:  08/26/21 130/72  08/29/20 118/70  08/25/19 140/72   Pulse Readings from Last 3 Encounters:  08/26/21 67  08/29/20 (!) 54  08/25/19 60     PROVIDERS: Kathleen Lime, NP is PCP [facility] [place]) Nahser, Arnette Norris, MD is cardiologist   LABS: For labs on arrival as indicated. Last results include: Lab Results  Component Value Date   WBC 6.7 10/11/2015   HGB 16.2 10/11/2015   HCT 49.7 10/11/2015   PLT 98 (L) 10/11/2015   GLUCOSE 115 (H) 08/26/2021   CHOL 115 08/26/2021   TRIG 189 (H) 08/26/2021   HDL 29 (L) 08/26/2021   LDLCALC 55 08/26/2021   ALT 18 08/26/2021   AST 28 08/29/2020   NA 138 08/26/2021   K 4.1 08/26/2021   CL 100 08/26/2021   CREATININE 1.23 08/26/2021   BUN 14 08/26/2021   CO2 25 08/26/2021   TSH 4.374 11/18/2015  A1c 6.5% 05/08/21 ([facility])   IMAGES: MRI L-spine 11/05/21 ([facility]): IMPRESSION: - Interval decompression at L5-S1. - Progression at other levels, most notably L4-L5 where there is marked canal stenosis and increased effacement of the  right greater than left subarticular recesses.    EKG: 08/26/21 ([facility]): Normal sinus rhythm.  Previous anterior infarct.  Rare PVCs.   CV: Echo 09/05/19: IMPRESSIONS   1. Left ventricular ejection fraction, by visual estimation, is 40 to  45%. The left ventricle has normal function. There is moderately increased  left ventricular hypertrophy.   2. Mid to distal anterior, distal anteroseptal, apical and inferoapical  hypokinesis to akinesis.   3. Left ventricular diastolic parameters are consistent with Grade I  diastolic dysfunction (impaired relaxation).   4. Global right ventricle was not well visualized.The right ventricular  size is not well visualized. Right vetricular wall thickness was not  assessed.   5. Left atrial size was normal.   6. Right atrial size was normal.   7. The mitral valve is abnormal. Mild mitral valve regurgitation.   8. The tricuspid valve is grossly normal. Tricuspid valve regurgitation  is trivial.   9. The aortic valve is tricuspid. Aortic valve regurgitation is not  visualized. Mild aortic valve sclerosis without stenosis.  10. The pulmonic valve was grossly normal. Pulmonic valve regurgitation is  mild.  11. The aortic root was not well visualized.  12. The inferior vena cava is normal in size with greater than 50%  respiratory variability, suggesting right atrial pressure of 3 mmHg.  13. The interatrial septum was not well visualized.    Nuclear  stress test 08/15/07: Overall impression: Low risk stress nuclear study.  There is evidence of a previous anterior apical MI but no evidence of ischemia.  The overall LV function is normal. EF 56%.   Past Medical History:  Diagnosis Date   Coronary artery disease    Dyslipidemia    Hypertension    MI, old    anterior wall   Mixed hyperlipidemia 07/23/2017    Past Surgical History:  Procedure Laterality Date   CARDIAC CATHETERIZATION  05/11/1995   normal left ventricular systolic  function with ef of 70-75%   EYE SURGERY      MEDICATIONS: No current facility-administered medications for this encounter.    acetaminophen (TYLENOL) 500 MG tablet   aspirin 81 MG tablet   hydrochlorothiazide (HYDRODIURIL) 25 MG tablet   Iodoquinol-HC (HYDROCORTISONE-IODOQUINOL) 1-1 % CREA   KLOR-CON M10 10 MEQ tablet   lisinopril (ZESTRIL) 20 MG tablet   meloxicam (MOBIC) 7.5 MG tablet   Multiple Vitamins-Minerals (PRESERVISION AREDS 2+MULTI VIT PO)   naproxen sodium (ALEVE) 220 MG tablet   nitroGLYCERIN (NITROSTAT) 0.4 MG SL tablet   rosuvastatin (CRESTOR) 10 MG tablet    Myra Gianotti, PA-C Surgical Short Stay/Anesthesiology [facility] Phone [phone] [facility] Phone [phone] 11/14/2021 4:08 PM

## 2021-11-17 ENCOUNTER — Inpatient Hospital Stay (HOSPITAL_COMMUNITY)
Admission: RE | Admit: 2021-11-17 | Discharge: 2021-11-19 | DRG: 455 | Disposition: A | Discharge status: Home Health | Payer: Medicare Other | Attending: Neurological Surgery | Admitting: Neurological Surgery

## 2021-11-17 ENCOUNTER — Inpatient Hospital Stay (HOSPITAL_COMMUNITY): Payer: Medicare Other | Admitting: Vascular Surgery

## 2021-11-17 ENCOUNTER — Encounter (HOSPITAL_COMMUNITY)
Admission: RE | Disposition: A | Discharge status: Home Health | Payer: Self-pay | Source: Home / Self Care | Attending: Neurological Surgery

## 2021-11-17 ENCOUNTER — Inpatient Hospital Stay (HOSPITAL_COMMUNITY): Payer: Medicare Other

## 2021-11-17 ENCOUNTER — Other Ambulatory Visit: Payer: Self-pay

## 2021-11-17 ENCOUNTER — Other Ambulatory Visit: Payer: Self-pay | Admitting: Cardiovascular Disease

## 2021-11-17 DIAGNOSIS — Z87442 Personal history of urinary calculi: Secondary | ICD-10-CM | POA: Diagnosis not present

## 2021-11-17 DIAGNOSIS — I2510 Atherosclerotic heart disease of native coronary artery without angina pectoris: Secondary | ICD-10-CM | POA: Diagnosis present

## 2021-11-17 DIAGNOSIS — Z419 Encounter for procedure for purposes other than remedying health state, unspecified: Secondary | ICD-10-CM

## 2021-11-17 DIAGNOSIS — Z8616 Personal history of COVID-19: Secondary | ICD-10-CM | POA: Diagnosis not present

## 2021-11-17 DIAGNOSIS — I252 Old myocardial infarction: Secondary | ICD-10-CM

## 2021-11-17 DIAGNOSIS — Z888 Allergy status to other drugs, medicaments and biological substances status: Secondary | ICD-10-CM | POA: Diagnosis not present

## 2021-11-17 DIAGNOSIS — M4726 Other spondylosis with radiculopathy, lumbar region: Secondary | ICD-10-CM | POA: Diagnosis present

## 2021-11-17 DIAGNOSIS — Z7982 Long term (current) use of aspirin: Secondary | ICD-10-CM

## 2021-11-17 DIAGNOSIS — I10 Essential (primary) hypertension: Secondary | ICD-10-CM | POA: Diagnosis present

## 2021-11-17 DIAGNOSIS — M48062 Spinal stenosis, lumbar region with neurogenic claudication: Principal | ICD-10-CM | POA: Diagnosis present

## 2021-11-17 DIAGNOSIS — Z791 Long term (current) use of non-steroidal anti-inflammatories (NSAID): Secondary | ICD-10-CM | POA: Diagnosis not present

## 2021-11-17 DIAGNOSIS — K219 Gastro-esophageal reflux disease without esophagitis: Secondary | ICD-10-CM | POA: Diagnosis present

## 2021-11-17 DIAGNOSIS — M4316 Spondylolisthesis, lumbar region: Secondary | ICD-10-CM | POA: Diagnosis present

## 2021-11-17 DIAGNOSIS — E782 Mixed hyperlipidemia: Secondary | ICD-10-CM | POA: Diagnosis present

## 2021-11-17 DIAGNOSIS — Z79899 Other long term (current) drug therapy: Secondary | ICD-10-CM | POA: Diagnosis not present

## 2021-11-17 DIAGNOSIS — Z91041 Radiographic dye allergy status: Secondary | ICD-10-CM

## 2021-11-17 DIAGNOSIS — M1990 Unspecified osteoarthritis, unspecified site: Secondary | ICD-10-CM | POA: Diagnosis present

## 2021-11-17 DIAGNOSIS — Z9861 Coronary angioplasty status: Secondary | ICD-10-CM | POA: Diagnosis not present

## 2021-11-17 HISTORY — DX: Gastro-esophageal reflux disease without esophagitis: K21.9

## 2021-11-17 HISTORY — DX: Personal history of urinary calculi: Z87.442

## 2021-11-17 HISTORY — DX: Failed or difficult intubation, initial encounter: T88.4XXA

## 2021-11-17 HISTORY — DX: Unspecified osteoarthritis, unspecified site: M19.90

## 2021-11-17 LAB — BASIC METABOLIC PANEL
Anion gap: 6 (ref 5–15)
BUN: 24 mg/dL — ABNORMAL HIGH (ref 8–23)
CO2: 28 mmol/L (ref 22–32)
Calcium: 8.5 mg/dL — ABNORMAL LOW (ref 8.9–10.3)
Chloride: 104 mmol/L (ref 98–111)
Creatinine, Ser: 1.36 mg/dL — ABNORMAL HIGH (ref 0.61–1.24)
GFR, Estimated: 52 mL/min — ABNORMAL LOW (ref >60)
Glucose, Bld: 127 mg/dL — ABNORMAL HIGH (ref 70–99)
Potassium: 4.2 mmol/L (ref 3.5–5.1)
Sodium: 138 mmol/L (ref 135–145)

## 2021-11-17 LAB — CBC
HCT: 44.6 % (ref 39.0–52.0)
Hemoglobin: 14.4 g/dL (ref 13.0–17.0)
MCH: 32.2 pg (ref 26.0–34.0)
MCHC: 32.3 g/dL (ref 30.0–36.0)
MCV: 99.8 fL (ref 80.0–100.0)
Platelets: 72 10*3/uL — ABNORMAL LOW (ref 150–400)
RBC: 4.47 MIL/uL (ref 4.22–5.81)
RDW: 13.5 % (ref 11.5–15.5)
WBC: 8.3 10*3/uL (ref 4.0–10.5)
nRBC: 0 % (ref 0.0–0.2)

## 2021-11-17 LAB — TYPE AND SCREEN
ABO/RH(D): A POS
Antibody Screen: NEGATIVE

## 2021-11-17 LAB — SURGICAL PCR SCREEN
MRSA, PCR: NEGATIVE
Staphylococcus aureus: NEGATIVE

## 2021-11-17 LAB — SARS CORONAVIRUS 2 BY RT PCR (HOSPITAL ORDER, PERFORMED IN ~~LOC~~ HOSPITAL LAB): SARS Coronavirus 2: NEGATIVE

## 2021-11-17 LAB — ABO/RH: ABO/RH(D): A POS

## 2021-11-17 SURGERY — POSTERIOR LUMBAR FUSION 1 LEVEL
Anesthesia: General | Site: Back

## 2021-11-17 MED ORDER — PHENOL 1.4 % MT LIQD: 1.0000 | OROMUCOSAL | Status: DC | PRN

## 2021-11-17 MED ORDER — SUCCINYLCHOLINE CHLORIDE 200 MG/10ML IV SOSY
PREFILLED_SYRINGE | INTRAVENOUS | Status: DC | PRN
  Administered 2021-11-17: 160 mg via INTRAVENOUS

## 2021-11-17 MED ORDER — ALBUMIN HUMAN 5 % IV SOLN: INTRAVENOUS | Status: DC | PRN

## 2021-11-17 MED ORDER — PROPOFOL 10 MG/ML IV BOLUS
INTRAVENOUS | Status: AC
  Filled 2021-11-17: qty 20

## 2021-11-17 MED ORDER — GLYCOPYRROLATE PF 0.2 MG/ML IJ SOSY
PREFILLED_SYRINGE | INTRAMUSCULAR | Status: DC | PRN
Start: 2021-11-17 — End: 2021-11-17
  Administered 2021-11-17: .1 mg via INTRAVENOUS

## 2021-11-17 MED ORDER — LIDOCAINE-EPINEPHRINE 1 %-1:100000 IJ SOLN
INTRAMUSCULAR | Status: AC
  Filled 2021-11-17: qty 1

## 2021-11-17 MED ORDER — NITROGLYCERIN 0.4 MG SL SUBL: 0.4000 mg | SUBLINGUAL_TABLET | SUBLINGUAL | Status: DC | PRN

## 2021-11-17 MED ORDER — MORPHINE SULFATE (PF) 2 MG/ML IV SOLN: 2.0000 mg | INTRAVENOUS | Status: DC | PRN

## 2021-11-17 MED ORDER — CHLORHEXIDINE GLUCONATE CLOTH 2 % EX PADS
6.0000 | MEDICATED_PAD | Freq: Every day | CUTANEOUS | Status: DC
  Administered 2021-11-17 – 2021-11-19 (×3): 6 via TOPICAL

## 2021-11-17 MED ORDER — POTASSIUM CHLORIDE CRYS ER 10 MEQ PO TBCR
10.0000 meq | EXTENDED_RELEASE_TABLET | Freq: Every day | ORAL | Status: DC
  Administered 2021-11-17 – 2021-11-19 (×3): 10 meq via ORAL
  Filled 2021-11-17 (×3): qty 1

## 2021-11-17 MED ORDER — LIDOCAINE 2% (20 MG/ML) 5 ML SYRINGE
INTRAMUSCULAR | Status: DC | PRN
Start: 2021-11-17 — End: 2021-11-17
  Administered 2021-11-17: 80 mg via INTRAVENOUS

## 2021-11-17 MED ORDER — LIDOCAINE 2% (20 MG/ML) 5 ML SYRINGE
INTRAMUSCULAR | Status: AC
  Filled 2021-11-17: qty 5

## 2021-11-17 MED ORDER — GLYCOPYRROLATE PF 0.2 MG/ML IJ SOSY
PREFILLED_SYRINGE | INTRAMUSCULAR | Status: AC
  Filled 2021-11-17: qty 1

## 2021-11-17 MED ORDER — ONDANSETRON HCL 4 MG/2ML IJ SOLN
INTRAMUSCULAR | Status: AC
  Filled 2021-11-17: qty 2

## 2021-11-17 MED ORDER — LACTATED RINGERS IV SOLN: INTRAVENOUS | Status: DC

## 2021-11-17 MED ORDER — ROSUVASTATIN CALCIUM 5 MG PO TABS
10.0000 mg | ORAL_TABLET | Freq: Every day | ORAL | Status: DC
  Administered 2021-11-17 – 2021-11-19 (×3): 10 mg via ORAL
  Filled 2021-11-17 (×3): qty 2

## 2021-11-17 MED ORDER — 0.9 % SODIUM CHLORIDE (POUR BTL) OPTIME
TOPICAL | Status: DC | PRN
  Administered 2021-11-17: 1000 mL

## 2021-11-17 MED ORDER — PROPOFOL 10 MG/ML IV BOLUS
INTRAVENOUS | Status: DC | PRN
  Administered 2021-11-17: 130 mg via INTRAVENOUS

## 2021-11-17 MED ORDER — CEFAZOLIN SODIUM-DEXTROSE 2-4 GM/100ML-% IV SOLN
2.0000 g | INTRAVENOUS | Status: AC
  Administered 2021-11-17: 2 g via INTRAVENOUS

## 2021-11-17 MED ORDER — FENTANYL CITRATE (PF) 250 MCG/5ML IJ SOLN
INTRAMUSCULAR | Status: AC
  Filled 2021-11-17: qty 5

## 2021-11-17 MED ORDER — FENTANYL CITRATE (PF) 250 MCG/5ML IJ SOLN
INTRAMUSCULAR | Status: DC | PRN
Start: 2021-11-17 — End: 2021-11-17
  Administered 2021-11-17: 25 ug via INTRAVENOUS
  Administered 2021-11-17 (×2): 50 ug via INTRAVENOUS
  Administered 2021-11-17 (×2): 25 ug via INTRAVENOUS
  Administered 2021-11-17: 50 ug via INTRAVENOUS
  Administered 2021-11-17: 100 ug via INTRAVENOUS
  Administered 2021-11-17: 50 ug via INTRAVENOUS

## 2021-11-17 MED ORDER — CHLORHEXIDINE GLUCONATE CLOTH 2 % EX PADS: 6.0000 | MEDICATED_PAD | Freq: Once | CUTANEOUS | Status: DC

## 2021-11-17 MED ORDER — METHOCARBAMOL 500 MG PO TABS
500.0000 mg | ORAL_TABLET | Freq: Four times a day (QID) | ORAL | Status: DC | PRN
  Administered 2021-11-17 – 2021-11-18 (×2): 500 mg via ORAL
  Filled 2021-11-17 (×2): qty 1

## 2021-11-17 MED ORDER — OXYCODONE-ACETAMINOPHEN 5-325 MG PO TABS
1.0000 | ORAL_TABLET | ORAL | Status: DC | PRN
  Administered 2021-11-17: 1 via ORAL
  Administered 2021-11-17: 2 via ORAL
  Filled 2021-11-17: qty 2
  Filled 2021-11-17: qty 1

## 2021-11-17 MED ORDER — SENNA 8.6 MG PO TABS
1.0000 | ORAL_TABLET | Freq: Two times a day (BID) | ORAL | Status: DC
  Administered 2021-11-17 – 2021-11-19 (×4): 8.6 mg via ORAL
  Filled 2021-11-17 (×4): qty 1

## 2021-11-17 MED ORDER — PHENYLEPHRINE 40 MCG/ML (10ML) SYRINGE FOR IV PUSH (FOR BLOOD PRESSURE SUPPORT)
PREFILLED_SYRINGE | INTRAVENOUS | Status: AC
  Filled 2021-11-17: qty 10

## 2021-11-17 MED ORDER — SUGAMMADEX SODIUM 200 MG/2ML IV SOLN
INTRAVENOUS | Status: DC | PRN
  Administered 2021-11-17: 200 mg via INTRAVENOUS

## 2021-11-17 MED ORDER — FENTANYL CITRATE (PF) 100 MCG/2ML IJ SOLN: 25.0000 ug | INTRAMUSCULAR | Status: DC | PRN

## 2021-11-17 MED ORDER — SUCCINYLCHOLINE CHLORIDE 200 MG/10ML IV SOSY
PREFILLED_SYRINGE | INTRAVENOUS | Status: AC
  Filled 2021-11-17: qty 10

## 2021-11-17 MED ORDER — LISINOPRIL 20 MG PO TABS
20.0000 mg | ORAL_TABLET | Freq: Every day | ORAL | Status: DC
  Administered 2021-11-17: 20 mg via ORAL
  Filled 2021-11-17 (×2): qty 1

## 2021-11-17 MED ORDER — ACETAMINOPHEN 325 MG PO TABS
650.0000 mg | ORAL_TABLET | ORAL | Status: DC | PRN
  Administered 2021-11-18: 08:00:00 650 mg via ORAL
  Filled 2021-11-17: qty 2

## 2021-11-17 MED ORDER — BISACODYL 10 MG RE SUPP
10.0000 mg | Freq: Every day | RECTAL | Status: DC | PRN
  Administered 2021-11-18: 19:00:00 10 mg via RECTAL
  Filled 2021-11-17: qty 1

## 2021-11-17 MED ORDER — ORAL CARE MOUTH RINSE: 15.0000 mL | Freq: Once | OROMUCOSAL | Status: AC

## 2021-11-17 MED ORDER — THROMBIN 20000 UNITS EX SOLR
CUTANEOUS | Status: AC
  Filled 2021-11-17: qty 20000

## 2021-11-17 MED ORDER — ROCURONIUM BROMIDE 10 MG/ML (PF) SYRINGE
PREFILLED_SYRINGE | INTRAVENOUS | Status: AC
  Filled 2021-11-17: qty 20

## 2021-11-17 MED ORDER — PHENYLEPHRINE 40 MCG/ML (10ML) SYRINGE FOR IV PUSH (FOR BLOOD PRESSURE SUPPORT)
PREFILLED_SYRINGE | INTRAVENOUS | Status: DC | PRN
  Administered 2021-11-17: 80 ug via INTRAVENOUS
  Administered 2021-11-17: 120 ug via INTRAVENOUS

## 2021-11-17 MED ORDER — BUPIVACAINE HCL (PF) 0.5 % IJ SOLN
INTRAMUSCULAR | Status: AC
  Filled 2021-11-17: qty 30

## 2021-11-17 MED ORDER — ONDANSETRON HCL 4 MG/2ML IJ SOLN: 4.0000 mg | Freq: Four times a day (QID) | INTRAMUSCULAR | Status: DC | PRN

## 2021-11-17 MED ORDER — DEXAMETHASONE SODIUM PHOSPHATE 10 MG/ML IJ SOLN
INTRAMUSCULAR | Status: DC | PRN
Start: 2021-11-17 — End: 2021-11-17
  Administered 2021-11-17: 10 mg via INTRAVENOUS

## 2021-11-17 MED ORDER — CEFAZOLIN SODIUM-DEXTROSE 2-4 GM/100ML-% IV SOLN
2.0000 g | Freq: Three times a day (TID) | INTRAVENOUS | Status: AC
  Administered 2021-11-17 – 2021-11-18 (×2): 2 g via INTRAVENOUS
  Filled 2021-11-17 (×2): qty 100

## 2021-11-17 MED ORDER — HYDROCHLOROTHIAZIDE 25 MG PO TABS
25.0000 mg | ORAL_TABLET | Freq: Every day | ORAL | Status: DC
Start: 2021-11-17 — End: 2021-11-19
  Administered 2021-11-17: 25 mg via ORAL
  Filled 2021-11-17 (×2): qty 1

## 2021-11-17 MED ORDER — ROCURONIUM BROMIDE 10 MG/ML (PF) SYRINGE
PREFILLED_SYRINGE | INTRAVENOUS | Status: DC | PRN
Start: 2021-11-17 — End: 2021-11-17
  Administered 2021-11-17: 30 mg via INTRAVENOUS
  Administered 2021-11-17: 50 mg via INTRAVENOUS
  Administered 2021-11-17: 20 mg via INTRAVENOUS
  Administered 2021-11-17: 30 mg via INTRAVENOUS

## 2021-11-17 MED ORDER — MENTHOL 3 MG MT LOZG: 1.0000 | LOZENGE | OROMUCOSAL | Status: DC | PRN

## 2021-11-17 MED ORDER — CHLORHEXIDINE GLUCONATE 0.12 % MT SOLN
15.0000 mL | Freq: Once | OROMUCOSAL | Status: AC
  Administered 2021-11-17: 15 mL via OROMUCOSAL
  Filled 2021-11-17: qty 15

## 2021-11-17 MED ORDER — DEXAMETHASONE SODIUM PHOSPHATE 10 MG/ML IJ SOLN
INTRAMUSCULAR | Status: AC
  Filled 2021-11-17: qty 1

## 2021-11-17 MED ORDER — SODIUM CHLORIDE 0.9% FLUSH: 3.0000 mL | INTRAVENOUS | Status: DC | PRN

## 2021-11-17 MED ORDER — METHOCARBAMOL 1000 MG/10ML IJ SOLN
500.0000 mg | Freq: Four times a day (QID) | INTRAVENOUS | Status: DC | PRN
  Filled 2021-11-17: qty 5

## 2021-11-17 MED ORDER — PHENYLEPHRINE HCL-NACL 20-0.9 MG/250ML-% IV SOLN
INTRAVENOUS | Status: DC | PRN
  Administered 2021-11-17: 30 ug/min via INTRAVENOUS

## 2021-11-17 MED ORDER — ALUM & MAG HYDROXIDE-SIMETH 200-200-20 MG/5ML PO SUSP: 30.0000 mL | Freq: Four times a day (QID) | ORAL | Status: DC | PRN

## 2021-11-17 MED ORDER — ACETAMINOPHEN 650 MG RE SUPP: 650.0000 mg | RECTAL | Status: DC | PRN

## 2021-11-17 MED ORDER — DOCUSATE SODIUM 100 MG PO CAPS
100.0000 mg | ORAL_CAPSULE | Freq: Two times a day (BID) | ORAL | Status: DC
  Administered 2021-11-17 – 2021-11-19 (×4): 100 mg via ORAL
  Filled 2021-11-17 (×4): qty 1

## 2021-11-17 MED ORDER — LIDOCAINE-EPINEPHRINE 1 %-1:100000 IJ SOLN
INTRAMUSCULAR | Status: DC | PRN
  Administered 2021-11-17: 5 mL

## 2021-11-17 MED ORDER — ONDANSETRON HCL 4 MG/2ML IJ SOLN
INTRAMUSCULAR | Status: DC | PRN
Start: 2021-11-17 — End: 2021-11-17
  Administered 2021-11-17: 4 mg via INTRAVENOUS

## 2021-11-17 MED ORDER — FLEET ENEMA 7-19 GM/118ML RE ENEM: 1.0000 | ENEMA | Freq: Once | RECTAL | Status: DC | PRN

## 2021-11-17 MED ORDER — CEFAZOLIN SODIUM-DEXTROSE 2-4 GM/100ML-% IV SOLN
INTRAVENOUS | Status: AC
  Filled 2021-11-17: qty 100

## 2021-11-17 MED ORDER — SODIUM CHLORIDE 0.9% FLUSH
3.0000 mL | Freq: Two times a day (BID) | INTRAVENOUS | Status: DC
  Administered 2021-11-18 (×2): 3 mL via INTRAVENOUS

## 2021-11-17 MED ORDER — THROMBIN 5000 UNITS EX SOLR
CUTANEOUS | Status: AC
  Filled 2021-11-17: qty 5000

## 2021-11-17 MED ORDER — BUPIVACAINE HCL (PF) 0.5 % IJ SOLN
INTRAMUSCULAR | Status: DC | PRN
  Administered 2021-11-17: 5 mL
  Administered 2021-11-17: 25 mL

## 2021-11-17 MED ORDER — ONDANSETRON HCL 4 MG PO TABS: 4.0000 mg | ORAL_TABLET | Freq: Four times a day (QID) | ORAL | Status: DC | PRN

## 2021-11-17 MED ORDER — SODIUM CHLORIDE 0.9 % IV SOLN: 250.0000 mL | INTRAVENOUS | Status: DC

## 2021-11-17 MED ORDER — POLYETHYLENE GLYCOL 3350 17 G PO PACK: 17.0000 g | PACK | Freq: Every day | ORAL | Status: DC | PRN

## 2021-11-17 MED ORDER — THROMBIN 5000 UNITS EX SOLR: CUTANEOUS | Status: DC | PRN

## 2021-11-17 SURGICAL SUPPLY — 69 items
ADH SKN CLS APL DERMABOND .7 (GAUZE/BANDAGES/DRESSINGS) ×1
APL SRG 60D 8 XTD TIP BNDBL (TIP)
BAG COUNTER SPONGE SURGICOUNT (BAG) ×3 IMPLANT
BAG SPNG CNTER NS LX DISP (BAG) ×2
BASKET BONE COLLECTION (BASKET) ×2 IMPLANT
BLADE CLIPPER SURG (BLADE) IMPLANT
BONE CANC CHIPS 20CC PCAN1/4 (Bone Implant) ×2 IMPLANT
BUR MATCHSTICK NEURO 3.0 LAGG (BURR) ×2 IMPLANT
CAGE COROENT LG 10X9X23-12 (Cage) ×2 IMPLANT
CANISTER SUCT 3000ML PPV (MISCELLANEOUS) ×2 IMPLANT
CHIPS CANC BONE 20CC PCAN1/4 (Bone Implant) ×1 IMPLANT
CNTNR URN SCR LID CUP LEK RST (MISCELLANEOUS) ×1 IMPLANT
CONT SPEC 4OZ STRL OR WHT (MISCELLANEOUS) ×2
COVER BACK TABLE 60X90IN (DRAPES) ×2 IMPLANT
DECANTER SPIKE VIAL GLASS SM (MISCELLANEOUS) ×2 IMPLANT
DERMABOND ADVANCED (GAUZE/BANDAGES/DRESSINGS) ×1
DERMABOND ADVANCED .7 DNX12 (GAUZE/BANDAGES/DRESSINGS) ×1 IMPLANT
DEVICE DISSECT PLASMABLAD 3.0S (MISCELLANEOUS) ×1 IMPLANT
DRAPE C-ARM 42X72 X-RAY (DRAPES) ×4 IMPLANT
DRAPE HALF SHEET 40X57 (DRAPES) IMPLANT
DRAPE LAPAROTOMY 100X72X124 (DRAPES) ×2 IMPLANT
DRSG OPSITE POSTOP 4X8 (GAUZE/BANDAGES/DRESSINGS) ×1 IMPLANT
DURAPREP 26ML APPLICATOR (WOUND CARE) ×2 IMPLANT
DURASEAL APPLICATOR TIP (TIP) IMPLANT
DURASEAL SPINE SEALANT 3ML (MISCELLANEOUS) IMPLANT
ELECT REM PT RETURN 9FT ADLT (ELECTROSURGICAL) ×2
ELECTRODE REM PT RTRN 9FT ADLT (ELECTROSURGICAL) ×1 IMPLANT
GAUZE 4X4 16PLY ~~LOC~~+RFID DBL (SPONGE) ×1 IMPLANT
GAUZE SPONGE 4X4 12PLY STRL (GAUZE/BANDAGES/DRESSINGS) ×2 IMPLANT
GLOVE SURG LTX SZ8.5 (GLOVE) ×4 IMPLANT
GLOVE SURG MICRO LTX SZ7 (GLOVE) ×2 IMPLANT
GLOVE SURG UNDER POLY LF SZ7 (GLOVE) ×2 IMPLANT
GLOVE SURG UNDER POLY LF SZ7.5 (GLOVE) ×2 IMPLANT
GLOVE SURG UNDER POLY LF SZ8.5 (GLOVE) ×4 IMPLANT
GOWN STRL REUS W/ TWL LRG LVL3 (GOWN DISPOSABLE) IMPLANT
GOWN STRL REUS W/ TWL XL LVL3 (GOWN DISPOSABLE) IMPLANT
GOWN STRL REUS W/TWL 2XL LVL3 (GOWN DISPOSABLE) ×4 IMPLANT
GOWN STRL REUS W/TWL LRG LVL3 (GOWN DISPOSABLE)
GOWN STRL REUS W/TWL XL LVL3 (GOWN DISPOSABLE)
GRAFT BNE CANC CHIPS 1-8 20CC (Bone Implant) IMPLANT
GRAFT BONE PROTEIOS LRG 5CC (Orthopedic Implant) ×1 IMPLANT
HEMOSTAT POWDER KIT SURGIFOAM (HEMOSTASIS) ×1 IMPLANT
KIT BASIN OR (CUSTOM PROCEDURE TRAY) ×2 IMPLANT
KIT GRAFTMAG DEL NEURO DISP (NEUROSURGERY SUPPLIES) ×2 IMPLANT
KIT TURNOVER KIT B (KITS) ×2 IMPLANT
MILL MEDIUM DISP (BLADE) ×2 IMPLANT
NEEDLE HYPO 22GX1.5 SAFETY (NEEDLE) ×2 IMPLANT
NS IRRIG 1000ML POUR BTL (IV SOLUTION) ×2 IMPLANT
PACK LAMINECTOMY NEURO (CUSTOM PROCEDURE TRAY) ×2 IMPLANT
PAD ARMBOARD 7.5X6 YLW CONV (MISCELLANEOUS) ×6 IMPLANT
PATTIES SURGICAL .5 X1 (DISPOSABLE) ×2 IMPLANT
PLASMABLADE 3.0S (MISCELLANEOUS) ×2
ROD RELINE-O LORD 5.5X35MM (Rod) ×1 IMPLANT
ROD RELINE-O LORD 5.5X40 (Rod) ×1 IMPLANT
SCREW LOCK RELINE 5.5 TULIP (Screw) ×4 IMPLANT
SCREW RELINE-O POLY 6.5X45 (Screw) ×4 IMPLANT
SPONGE SURGIFOAM ABS GEL 100 (HEMOSTASIS) ×2 IMPLANT
SPONGE T-LAP 4X18 ~~LOC~~+RFID (SPONGE) ×1 IMPLANT
SUT PROLENE 6 0 BV (SUTURE) IMPLANT
SUT VIC AB 1 CT1 18XBRD ANBCTR (SUTURE) ×1 IMPLANT
SUT VIC AB 1 CT1 8-18 (SUTURE) ×2
SUT VIC AB 2-0 CP2 18 (SUTURE) ×2 IMPLANT
SUT VIC AB 3-0 SH 8-18 (SUTURE) ×2 IMPLANT
SUT VIC AB 4-0 RB1 18 (SUTURE) ×2 IMPLANT
SYR 3ML LL SCALE MARK (SYRINGE) ×8 IMPLANT
TOWEL GREEN STERILE (TOWEL DISPOSABLE) ×2 IMPLANT
TOWEL GREEN STERILE FF (TOWEL DISPOSABLE) ×2 IMPLANT
TRAY FOLEY MTR SLVR 16FR STAT (SET/KITS/TRAYS/PACK) ×2 IMPLANT
WATER STERILE IRR 1000ML POUR (IV SOLUTION) ×2 IMPLANT

## 2021-11-17 NOTE — Transfer of Care (Signed)
Immediate Anesthesia Transfer of Care Note  Patient: Clinton Garza  Procedure(s) Performed: Lumbar four-five Posterior Iumbar Interbody Fusion (Back)  Patient Location: PACU  Anesthesia Type:General  Level of Consciousness: drowsy  Airway & Oxygen Therapy: Patient Spontanous Breathing and Patient connected to face mask oxygen  Post-op Assessment: Report given to RN and Post -op Vital signs reviewed and stable  Post vital signs: Reviewed and stable  Last Vitals:  Vitals Value Taken Time  BP 144/67   Temp    Pulse 70   Resp 12   SpO2 100     Last Pain:  Vitals:   11/17/21 0649  TempSrc:   PainSc: 0-No pain         Complications: No notable events documented.

## 2021-11-17 NOTE — Anesthesia Procedure Notes (Signed)
Procedure Name: Intubation Date/Time: 11/17/2021 8:23 AM Performed by: Lorie Phenix, CRNA Pre-anesthesia Checklist: Patient identified, Emergency Drugs available, Suction available and Patient being monitored Patient Re-evaluated:Patient Re-evaluated prior to induction Oxygen Delivery Method: Circle system utilized Preoxygenation: Pre-oxygenation with 100% oxygen Induction Type: IV induction Ventilation: Mask ventilation without difficulty Laryngoscope Size: Glidescope and 4 Grade View: Grade I Tube type: Oral Tube size: 7.5 mm Number of attempts: 1 Airway Equipment and Method: Rigid stylet Placement Confirmation: ETT inserted through vocal cords under direct vision, positive ETCO2 and breath sounds checked- equal and bilateral Secured at: 24 cm Tube secured with: Tape Dental Injury: Teeth and Oropharynx as per pre-operative assessment  Difficulty Due To: Difficulty was anticipated

## 2021-11-17 NOTE — Anesthesia Postprocedure Evaluation (Signed)
Anesthesia Post Note  Patient: Clinton Garza  Procedure(s) Performed: Lumbar four-five Posterior Iumbar Interbody Fusion (Back)     Patient location during evaluation: PACU Anesthesia Type: General Level of consciousness: awake Pain management: pain level controlled Vital Signs Assessment: post-procedure vital signs reviewed and stable Cardiovascular status: stable Postop Assessment: no apparent nausea or vomiting Anesthetic complications: no   No notable events documented.  Last Vitals:  Vitals:   11/17/21 1330 11/17/21 1400  BP: (!) 152/72 (!) 152/72  Pulse: 66 69  Resp: 17 18  Temp: 36.6 C   SpO2: 95% 94%    Last Pain:  Vitals:   11/17/21 1400  TempSrc:   PainSc: 0-No pain                 [name]

## 2021-11-17 NOTE — H&P (Signed)
Clinton Garza is an 86 y.o. male.   Chief Complaint: Back pain bilateral leg pain and weakness difficulty walking HPI: Clinton Garza is an 86 year old individual whose had worsening progressive spondylitic stenosis in the lumbar spine in 2020 March and MRI demonstrated severe stenosis for the L5 nerve roots at the L5-S1 level he underwent surgical decompression was markedly improved he got along well for the last year and a half until he started developing worsening pain and weakness in his legs.  He was found to have developed a spondylolisthesis at the level of L4-L5 with central canal stenosis and lateral recess stenosis the progression had been rather dramatic.  He also has some modest spondylitic changes at L3-4 and L2-3 however his current problem is distal lower extremity weakness in the tibialis anterior secondary to stenosis at L2 4 5.  He is lost his ability to ambulate independently and requires significant help with ambulation including use of a walker he has been advised regarding surgery to decompress and stabilize the L4-L5 level and this is now being performed.  Past Medical History:  Diagnosis Date   Arthritis    Coronary artery disease    Difficult intubation    Dyslipidemia    GERD (gastroesophageal reflux disease)    no meds, diet controlled   History of kidney stones    passed surgery   Hypertension    MI, old    anterior wall   Mixed hyperlipidemia 07/23/2017    Past Surgical History:  Procedure Laterality Date   BACK SURGERY     CARDIAC CATHETERIZATION  05/11/1995   normal left ventricular systolic function with ef of 70-75%   COLONOSCOPY     EYE SURGERY Bilateral    cataracts removed   NECK SURGERY      Family History  Problem Relation Age of Onset   Cancer Mother    Heart Problems Father    Social History:  reports that he has never smoked. He has never used smokeless tobacco. He reports that he does not drink alcohol and does not use drugs.  Allergies:   Allergies  Allergen Reactions   Lipitor [Atorvastatin] Hives   Antivert [Meclizine Hcl]     Pt does not remember reaction    Iodinated Contrast Media     Itching, turned red     Medications Prior to Admission  Medication Sig Dispense Refill   acetaminophen (TYLENOL) 500 MG tablet Take 1,000 mg by mouth in the morning and at bedtime.     aspirin 81 MG tablet Take 81 mg by mouth daily.       hydrochlorothiazide (HYDRODIURIL) 25 MG tablet TAKE 1 TABLET BY MOUTH EVERY DAY 90 tablet 3   KLOR-CON M10 10 MEQ tablet TAKE 1 TABLET BY MOUTH EVERY DAY 90 tablet 0   lisinopril (ZESTRIL) 20 MG tablet TAKE 1 TABLET BY MOUTH EVERY DAY 90 tablet 3   meloxicam (MOBIC) 7.5 MG tablet Take 7.5 mg by mouth at bedtime.     Multiple Vitamins-Minerals (PRESERVISION AREDS 2+MULTI VIT PO) Take 1 capsule by mouth in the morning and at bedtime.     naproxen sodium (ALEVE) 220 MG tablet Take 440 mg by mouth 2 (two) times daily as needed (pain).     nitroGLYCERIN (NITROSTAT) 0.4 MG SL tablet Place 1 tablet (0.4 mg total) under the tongue every 5 (five) minutes as needed. 25 tablet 1   oxyCODONE-acetaminophen (PERCOCET/ROXICET) 5-325 MG tablet Take by mouth every 4 (four) hours as needed for severe  pain.     rosuvastatin (CRESTOR) 10 MG tablet TAKE 1 TABLET BY MOUTH EVERY DAY 90 tablet 3   Iodoquinol-HC (HYDROCORTISONE-IODOQUINOL) 1-1 % CREA Place 1 application onto the skin 2 (two) times daily as needed for rash.  1    Results for orders placed or performed during the hospital encounter of 11/17/21 (from the past 48 hour(s))  SARS Coronavirus 2 by RT PCR (hospital order, performed in [facility]) Nasopharyngeal Nasopharyngeal Swab     Status: None   Collection Time: 11/17/21  5:53 AM   Specimen: Nasopharyngeal Swab  Result Value Ref Range   SARS Coronavirus 2 NEGATIVE NEGATIVE    Comment: (NOTE) SARS-CoV-2 target nucleic acids are NOT DETECTED.  The SARS-CoV-2 RNA is generally detectable in upper  and lower respiratory specimens during the acute phase of infection. The lowest concentration of SARS-CoV-2 viral copies this assay can detect is 250 copies / mL. A negative result does not preclude SARS-CoV-2 infection and should not be used as the sole basis for treatment or other patient management decisions.  A negative result may occur with improper specimen collection / handling, submission of specimen other than nasopharyngeal swab, presence of viral mutation(s) within the areas targeted by this assay, and inadequate number of viral copies (<250 copies / mL). A negative result must be combined with clinical observations, patient history, and epidemiological information.  Fact Sheet for Patients:   BoilerBrush.com.cyhttps://www.fda.gov/media/136312/download  Fact Sheet for Healthcare Providers: https://pope.com/https://www.fda.gov/media/136313/download  This test is not yet approved or  cleared by the [place] FDA and has been authorized for detection and/or diagnosis of SARS-CoV-2 by FDA under an Emergency Use Authorization (EUA).  This EUA will remain in effect (meaning this test can be used) for the duration of the COVID-19 declaration under Section 564(b)(1) of the Act, 21 U.S.C. section 360bbb-3(b)(1), unless the authorization is terminated or revoked sooner.  Performed at [facility], [address] [place], [place] [address]   Basic metabolic panel per protocol     Status: Abnormal   Collection Time: 11/17/21  6:13 AM  Result Value Ref Range   Sodium 138 135 - 145 mmol/L   Potassium 4.2 3.5 - 5.1 mmol/L   Chloride 104 98 - 111 mmol/L   CO2 28 22 - 32 mmol/L   Glucose, Bld 127 (H) 70 - 99 mg/dL    Comment: Glucose reference range applies only to samples taken after fasting for at least 8 hours.   BUN 24 (H) 8 - 23 mg/dL   Creatinine, Ser 6.041.36 (H) 0.61 - 1.24 mg/dL   Calcium 8.5 (L) 8.9 - 10.3 mg/dL   GFR, Estimated 52 (L) >60 mL/min    Comment: (NOTE) Calculated using the CKD-EPI  Creatinine Equation (2021)    Anion gap 6 5 - 15    Comment: Performed at [facility], [address] [place], [place] [address]  CBC per protocol     Status: Abnormal   Collection Time: 11/17/21  6:13 AM  Result Value Ref Range   WBC 8.3 4.0 - 10.5 K/uL   RBC 4.47 4.22 - 5.81 MIL/uL   Hemoglobin 14.4 13.0 - 17.0 g/dL   HCT 11.944.6 14.739.0 - 82.952.0 %   MCV 99.8 80.0 - 100.0 fL   MCH 32.2 26.0 - 34.0 pg   MCHC 32.3 30.0 - 36.0 g/dL   RDW 56.213.5 13.011.5 - 86.515.5 %   Platelets 72 (L) 150 - 400 K/uL    Comment:  Immature Platelet Fraction may be clinically indicated, consider ordering this additional test [id number] REPEATED TO VERIFY PLATELET COUNT CONFIRMED BY SMEAR    nRBC 0.0 0.0 - 0.2 %    Comment: Performed at [facility], [address], [place], [place] [address]  Type and screen     Status: None   Collection Time: 11/17/21  6:45 AM  Result Value Ref Range   ABO/RH(D) A POS    Antibody Screen NEG    Sample Expiration      11/20/2021,2359 Performed at [facility], [address] [place], [place] [address]   ABO/Rh     Status: None   Collection Time: 11/17/21  7:13 AM  Result Value Ref Range   ABO/RH(D)      A POS Performed at [facility], [address], [place], [place] [address]    No results found.  Review of Systems  Constitutional:  Positive for activity change.  HENT: Negative.    Eyes: Negative.   Respiratory: Negative.    Cardiovascular: Negative.   Gastrointestinal: Negative.   Genitourinary: Negative.   Musculoskeletal:  Positive for back pain, gait problem and myalgias.  Allergic/Immunologic: Negative.   Neurological:  Positive for weakness and numbness.  Hematological: Negative.   Psychiatric/Behavioral: Negative.     Blood pressure (!) 128/57, pulse (!) 57, temperature 97.7 F (36.5 C), temperature source Oral, resp. rate 17, height 5\' 7"  (1.702 m), weight 88.5 kg, SpO2 96 %. Physical Exam Constitutional:      Appearance:  Normal appearance. He is normal weight.  HENT:     Head: Normocephalic and atraumatic.     Right Ear: Tympanic membrane normal.     Left Ear: Tympanic membrane normal.     Nose: Nose normal.     Mouth/Throat:     Mouth: Mucous membranes are moist.  Eyes:     Extraocular Movements: Extraocular movements intact.     Conjunctiva/sclera: Conjunctivae normal.     Pupils: Pupils are equal, round, and reactive to light.  Cardiovascular:     Rate and Rhythm: Normal rate.     Pulses: Normal pulses.     Heart sounds: Normal heart sounds.  Pulmonary:     Effort: Pulmonary effort is normal.     Breath sounds: Normal breath sounds.  Abdominal:     General: Abdomen is flat. Bowel sounds are normal.     Palpations: Abdomen is soft.  Musculoskeletal:     Cervical back: Normal range of motion.     Comments: Positive straight leg raising at 15 degrees in either lower extremity.  Patrick's maneuver is negative bilaterally.  Skin:    General: Skin is warm.  Neurological:     Mental Status: He is alert.     Comments: Radial nerve examination is in the limits of normal.  Upper extremity strength is normal in the biceps triceps grips and intrinsics.  Reflexes are trace in the biceps 1+ in the triceps 2+ in the brachioradialis.  In the lower extremities reflexes are absent in the patellae and the Achilles both.  There is marked weakness in the dorsiflexors bilaterally 3 out of 5 on the left 4 out of 5 on the right extensor houses longus is absent both lower extremities gastroc strength is graded at 4 out of 5 bilaterally.  Proximal leg strength in iliopsoas is 4+ out of 5 bilaterally quadricep strength is also 4+ out of 5 bilaterally.  Sensation is diminished below the  level of the knees and anterior shins and down on the dorsum of the feet to pin and light touch.     Assessment/Plan Spondylolisthesis L4-L5 with stenosis neurogenic claudication, lumbar radiculopathy.  Plan: Decompression fusion  L4-L5.  Stefani DamaHenry J Breann Losano, MD 11/17/2021, 7:53 AM

## 2021-11-17 NOTE — Op Note (Signed)
Date of surgery: 11/17/2021 Preoperative diagnosis: Spondylolisthesis L4-L5 with lumbar radiculopathy, lumbar spinal stenosis. Postoperative diagnosis: Same Procedure: Bilateral laminotomies and decompression of L4 and L5 nerve root more work than required for simple interbody discectomy.  Posterior lumbar interbody arthrodesis with peek spacers local autograft and allograft Proteus in the interbody space in addition to posterior lateral arthrodesis with local autograft allograft and Proteus in the anterior transverse space.  Pedicle screw fixation L4-L5. Surgeon: Barnett Abu First Assistant: Ervin Knack, MD Anesthesia: General endotracheal Indications: Clinton Garza is an 86 year old individual whose had significant back bilateral lower extremity pain and weakness.  He has had previous stenosis at the L5-S1 level which was decompressed and the patient did quite well he is now admitted to undergo surgical decompression and stabilization at L4-L5 secondary to a spondylolisthesis.  Procedure: Patient was brought to the operating room supine on the stretcher.  After the smooth induction of general endotracheal anesthesia, he was carefully turned prone.  Back was prepped with alcohol DuraPrep and draped in a sterile fashion.  Midline incision was created and carried down to the lumbodorsal fascia which was opened on either side of midline.  Localizing radiographs identified positively the interspace at L4-L5.  Then a large laminotomy was created removing the inferior margin lamina of L4 out to and including the entirety of the facet joint.  The thickened redundant yellow ligament in this region was then taken up and a decompression was performed of the common dural tube the takeoff of the L4 nerve root superiorly and the L5 nerve root inferiorly.  The bone was harvested from this region and used for autograft.  The disc space was isolated.  Once the nerve roots were adequately decompressed then the disc  base was entered with a #15 blade.  Combination of curettes and rongeurs was used to remove substantial quantities of degenerated disc material.  This procedure was carried out bilaterally and once a total discectomy was performed the interspace was sized for the appropriate size spacer it was ultimately felt that a 10 x 9 x 23 mm spacer with 12 degrees lordosis would fit best into this interval.  This was packed with autograft allograft and Proteus material and packed into the interspace along with 20 cc of bone graft.  Once the space was packed completely the intertransverse spaces were cleared an additional 6 cc of bone graft was packed into each intertransverse space at L4-L5.  Pedicle entry sites were then chosen at L4 and L5 using fluoroscopic imaging and 6.5 x 45 mm screws were placed into the pedicles of L4 and L5.  Short precontoured rods were used with a 40 mm rod on the right side and 35 mm rod on the left system was then tightened in a neutral construct.  The area was copiously irrigated with antibiotic irrigating solution and then the lumbodorsal fascia was closed with #1 Vicryl in interrupted fashion 2-0 Vicryl was used in the subcutaneous tissues 3-0 Vicryl and 4-0 Vicryl in subcuticular layers.  Dermabond was placed on the skin.  Blood loss was estimated 400 cc.  Patient was returned to recovery room in stable condition.

## 2021-11-17 NOTE — Progress Notes (Signed)
Orthopedic Tech Progress Note Patient Details:  Clinton Garza [birth year] [id number]  Ortho Devices Type of Ortho Device: Lumbar corsett Ortho Device/Splint Location: BACK Ortho Device/Splint Interventions: Ordered   Post Interventions Patient Tolerated: Well Instructions Provided: Care of device  [name] 11/17/2021, 2:34 PM

## 2021-11-18 LAB — CBC
HCT: 36.2 % — ABNORMAL LOW (ref 39.0–52.0)
Hemoglobin: 12.3 g/dL — ABNORMAL LOW (ref 13.0–17.0)
MCH: 33.1 pg (ref 26.0–34.0)
MCHC: 34 g/dL (ref 30.0–36.0)
MCV: 97.3 fL (ref 80.0–100.0)
Platelets: 74 10*3/uL — ABNORMAL LOW (ref 150–400)
RBC: 3.72 MIL/uL — ABNORMAL LOW (ref 4.22–5.81)
RDW: 13 % (ref 11.5–15.5)
WBC: 20.9 10*3/uL — ABNORMAL HIGH (ref 4.0–10.5)
nRBC: 0 % (ref 0.0–0.2)

## 2021-11-18 LAB — BASIC METABOLIC PANEL
Anion gap: 7 (ref 5–15)
BUN: 22 mg/dL (ref 8–23)
CO2: 26 mmol/L (ref 22–32)
Calcium: 8.2 mg/dL — ABNORMAL LOW (ref 8.9–10.3)
Chloride: 100 mmol/L (ref 98–111)
Creatinine, Ser: 1.52 mg/dL — ABNORMAL HIGH (ref 0.61–1.24)
GFR, Estimated: 45 mL/min — ABNORMAL LOW (ref >60)
Glucose, Bld: 169 mg/dL — ABNORMAL HIGH (ref 70–99)
Potassium: 4.3 mmol/L (ref 3.5–5.1)
Sodium: 133 mmol/L — ABNORMAL LOW (ref 135–145)

## 2021-11-18 MED ORDER — ACETAMINOPHEN 500 MG PO TABS
1000.0000 mg | ORAL_TABLET | Freq: Four times a day (QID) | ORAL | Status: DC
  Administered 2021-11-18 – 2021-11-19 (×4): 1000 mg via ORAL
  Filled 2021-11-18 (×5): qty 2

## 2021-11-18 MED ORDER — TRAMADOL HCL 50 MG PO TABS
50.0000 mg | ORAL_TABLET | Freq: Four times a day (QID) | ORAL | Status: DC | PRN
  Administered 2021-11-18 – 2021-11-19 (×2): 50 mg via ORAL
  Filled 2021-11-18 (×2): qty 1

## 2021-11-18 MED ORDER — TAMSULOSIN HCL 0.4 MG PO CAPS
0.4000 mg | ORAL_CAPSULE | Freq: Every day | ORAL | Status: DC
  Administered 2021-11-18 – 2021-11-19 (×2): 0.4 mg via ORAL
  Filled 2021-11-18 (×2): qty 1

## 2021-11-18 MED ORDER — ACETAMINOPHEN 325 MG PO TABS: 650.0000 mg | ORAL_TABLET | Freq: Four times a day (QID) | ORAL | Status: DC

## 2021-11-18 NOTE — Progress Notes (Signed)
Patient ID: Clinton Garza, male   DOB: [birth year], 86 y.o.   MRN: [id number] Vital signs are stable Motor function is improving particularly in left lower extremity where he was quite weak Incision is clean and dry Patient was very slow to move and noted an a lot of retained gas today Bowels of finally move this evening Postop labs demonstrate his hemoglobin is 12 and his electrolytes are stable Plan for discharge tomorrow if he continues to improve overnight.

## 2021-11-18 NOTE — TOC Initial Note (Addendum)
Transition of Care [facility]) - Initial/Assessment Note    Patient Details  Name: Clinton Garza MRN: [id number] Date of Birth: August 08, 1936  Transition of Care [facility]) CM/SW Contact:    Lawerance Sabal, RN Phone Number: 11/18/2021, 3:25 PM  Clinical Narrative:                Sherron Monday w patient wife and daughter at bedside. They are in agreement for [facility] services. Patient has not had HH services previously. They are agreeable to any highly rated provider.  Unit staff will provide any needed DME from [facility]  [facility] accepted notified by Liborio Nixon that patient was set up pre admission   Expected Discharge Plan: Home w Home Health Services Barriers to Discharge: Continued Medical Work up   Patient Goals and CMS Choice Patient states their goals for this hospitalization and ongoing recovery are:: to go home CMS Medicare.gov Compare Post Acute Care list provided to:: Patient Choice offered to / list presented to : Patient  Expected Discharge Plan and Services Expected Discharge Plan: Home w Home Health Services   Discharge Planning Services: CM Consult Post Acute Care Choice: Home Health, Durable Medical Equipment Living arrangements for the past 2 months: Single Family Home                         Representative spoke with at DME Agency: DME will be supplied by unit staff HH Arranged: PT, OT  Date HH Agency Contacted: 11/18/21 Time HH Agency Contacted: [phone]   Prior Living Arrangements/Services Living arrangements for the past 2 months: Single Family Home Lives with:: Spouse   Do you feel safe going back to the place where you live?: Yes               Activities of Daily Living Home Assistive Devices/Equipment: Walker (specify type), Cane (specify quad or straight), Other (Comment) (raised toilet seat) ADL Screening (condition at time of admission) Patient's cognitive ability adequate to safely complete daily activities?: Yes Is the patient deaf or have difficulty hearing?:  No Does the patient have difficulty seeing, even when wearing glasses/contacts?: No Does the patient have difficulty concentrating, remembering, or making decisions?: No Patient able to express need for assistance with ADLs?: Yes Does the patient have difficulty dressing or bathing?: Yes Independently performs ADLs?: No Communication: Independent Dressing (OT): Needs assistance Is this a change from baseline?: Pre-admission baseline Grooming: Independent Feeding: Independent Bathing: Needs assistance Is this a change from baseline?: Pre-admission baseline Toileting: Independent In/Out Bed: Independent Walks in Home: Independent with device (comment) Does the patient have difficulty walking or climbing stairs?: Yes (little difficulty d/t pain) Weakness of Legs: Both Weakness of Arms/Hands: None  Permission Sought/Granted                  Emotional Assessment              Admission diagnosis:  Spondylolisthesis of lumbar region [M43.16] Patient Active Problem List   Diagnosis Date Noted   Spondylolisthesis of lumbar region 11/17/2021   Mixed hyperlipidemia 07/23/2017   Coronary artery disease    Hypertension    Dyslipidemia    MI, old    PCP:  Levy Sjogren, NP Pharmacy:   [id number] G[place] Otto, [place] - [address] AT [facility] [address] [place] [address] Phone: [phone] Fax: [phone]     Social Determinants of Health (SDOH) Interventions    Readmission Risk Interventions No flowsheet data  found.

## 2021-11-18 NOTE — Evaluation (Signed)
Occupational Therapy Evaluation Patient Details Name: Clinton LinsDavid Thorns MRN: [id number] DOB: Oct 25, 1936 Today's Date: 11/18/2021   History of Present Illness Clinton Garza is an 86 year old individual whose had significant back bilateral lower extremity pain and weakness adn underswent Lumbar four-five Posterior Iumbar Interbody Fusion 1/23. PMHx: arthritis, CAD, dyslipidemia, GERD, HTN, old MI   Clinical Impression   Clinton Garza required assist from family for transfers, mobility and ADLs due to back and LE pain/weakness. Per report he uses a RW for most ambulation (has had falls with a rollator), sponge bathes, sleeps in recliner and has assist as needed for other ADLs. He lives in a 1 level home, 3 STE with his wife and other family near by who can assist as needed. Throughout evaluation, pt required max cues for back precautions, sequencing, safety and RW management. Pt was impulsive with poor command following throughout, he will benefit from continued education (& family education) on back precautions, safe transfers and DME use. Overall he required mod-max A for mobility and up to max A for ADLs. He will benefit from continued OT acutely. Recommend d/c home with family and HHOT.      Recommendations for follow up therapy are one component of a multi-disciplinary discharge planning process, led by the attending physician.  Recommendations may be updated based on patient status, additional functional criteria and insurance authorization.   Follow Up Recommendations  Home health OT    Assistance Recommended at Discharge Frequent or constant Supervision/Assistance  Patient can return home with the following A lot of help with walking and/or transfers;A lot of help with bathing/dressing/bathroom;Assist for transportation;Help with stairs or ramp for entrance    Functional Status Assessment  Patient has had a recent decline in their functional status and demonstrates the ability to make significant  improvements in function in a reasonable and predictable amount of time.  Equipment Recommendations  BSC/3in1       Precautions / Restrictions Precautions Precautions: Back;Fall Precaution Booklet Issued: Yes (comment) Required Braces or Orthoses: Spinal Brace Spinal Brace: Lumbar corset;Applied in sitting position Restrictions Weight Bearing Restrictions: No      Mobility Bed Mobility Overal bed mobility: Needs Assistance Bed Mobility: Rolling, Sidelying to Sit, Sit to Sidelying Rolling: Min assist Sidelying to sit: Mod assist     Sit to sidelying: Mod assist, +2 for physical assistance, +2 for safety/equipment General bed mobility comments: max verbal cues throughout, pt impulsive with poor command following    Transfers Overall transfer level: Needs assistance Equipment used: Rolling walker (2 wheels) Transfers: Sit to/from Stand Sit to Stand: Mod assist, +2 physical assistance, +2 safety/equipment, Max assist           General transfer comment: mod A for physical assistance, max A for verbal cues. pt seemingly ignoring cues for safe hand placement for each transfer wtih RW despite education and max cues      Balance Overall balance assessment: Needs assistance, History of Falls Sitting-balance support: Feet supported Sitting balance-Leahy Scale: Fair     Standing balance support: Bilateral upper extremity supported Standing balance-Leahy Scale: Poor               ADL either performed or assessed with clinical judgement   ADL Overall ADL's : Needs assistance/impaired Eating/Feeding: Independent;Sitting   Grooming: Set up;Sitting   Upper Body Bathing: Sitting;Minimal assistance   Lower Body Bathing: Maximal assistance;Sitting/lateral leans   Upper Body Dressing : Set up;Sitting Upper Body Dressing Details (indicate cue type and reason): min-max A for brace Lower  Body Dressing: Maximal assistance;Sit to/from stand   Toilet Transfer: Maximal  assistance;Ambulation;Rolling walker (2 wheels) Toilet Transfer Details (indicate cue type and reason): max A for powering into standing, verbal cues and safety Toileting- Clothing Manipulation and Hygiene: Moderate assistance;Sitting/lateral lean Toileting - Clothing Manipulation Details (indicate cue type and reason): assist for back precautions     Functional mobility during ADLs: Maximal assistance;Rolling walker (2 wheels) General ADL Comments: pt required max cues for back precautions, RW management and safety.     Vision Baseline Vision/History: 0 No visual deficits Ability to See in Adequate Light: 0 Adequate Patient Visual Report: No change from baseline Vision Assessment?: No apparent visual deficits      Pertinent Vitals/Pain Pain Assessment Pain Assessment: Faces Faces Pain Scale: Hurts a little bit Pain Location: back/sx site Pain Descriptors / Indicators: Discomfort, Grimacing Pain Intervention(s): Limited activity within patient's tolerance, Monitored during session     Hand Dominance Right   Extremity/Trunk Assessment Upper Extremity Assessment Upper Extremity Assessment: Generalized weakness (pt able to touch the back of his head with each UE. Per report, this is an imrpovement since surgery. He has painful arthritis in each hand, limiting strength, numbness/tingling in each hand.)   Lower Extremity Assessment Lower Extremity Assessment: Defer to PT evaluation   Cervical / Trunk Assessment Cervical / Trunk Assessment: Back Surgery   Communication Communication Communication: No difficulties   Cognition Arousal/Alertness: Awake/alert Behavior During Therapy: Impulsive Overall Cognitive Status: Impaired/Different from baseline Area of Impairment: Attention, Following commands, Memory, Safety/judgement, Awareness, Problem solving                   Current Attention Level: Selective Memory: Decreased recall of precautions Following Commands: Follows  one step commands with increased time Safety/Judgement: Decreased awareness of safety, Decreased awareness of deficits Awareness: Intellectual Problem Solving: Requires verbal cues General Comments: Pt impulsive with poor safety awareness during transfers. Required max cues for hand placement with RW, ignored cues and transferred unsafely each time. "I know how I do it at home." Wife states he does not follow cues well at home at baseline. Pt unable to multitask, and is self-distracting.     General Comments  VSS on RA, pt's wife and son present            Home Living Family/patient expects to be discharged to:: Private residence Living Arrangements: Spouse/significant other Available Help at Discharge: Family;Available 24 hours/day Type of Home: House Home Access: Stairs to enter [employer] of Steps: 3   Home Layout: One level     Bathroom Shower/Tub: Sponge bathes at baseline   [employer]: Standard     Home Equipment: Agricultural consultant (2 wheels);Standard Walker          Prior Functioning/Environment Prior Level of Function : Driving;Needs assist       Physical Assist : Mobility (physical);ADLs (physical) Mobility (physical): Bed mobility;Transfers;Gait;Stairs ADLs (physical): Grooming;Dressing;Bathing;Toileting;IADLs Mobility Comments: Walks with a RW, has had falls with rollator. Family assists with transfers as needed ADLs Comments: Sleeps in recliner, family assists with LB ADLs as needed. Sponge bathes at baseline. Drives.        OT Problem List: Decreased strength;Decreased range of motion;Decreased activity tolerance;Impaired balance (sitting and/or standing);Decreased safety awareness;Decreased knowledge of use of DME or AE;Decreased knowledge of precautions;Pain      OT Treatment/Interventions:      OT Goals(Current goals can be found in the care plan section) Acute Rehab OT Goals Patient Stated Goal: home OT Goal Formulation: With  patient Time For Goal Achievement: 12/30/21 Potential to Achieve Goals: Good ADL Goals Pt Will Perform Grooming: with supervision;standing Pt Will Perform Lower Body Bathing: with min assist;sit to/from stand Pt Will Perform Lower Body Dressing: with min assist;sit to/from stand Pt Will Transfer to Toilet: with min guard assist;ambulating Additional ADL Goal #1: pt will indep verbalize 3/3 back precautions as a precursor for safe ADLs Additional ADL Goal #2: pt will indep complete bed mobility with log roll as a precursor to ADLs  OT Frequency: Min 2X/week    Co-evaluation PT/OT/SLP Co-Evaluation/Treatment: Yes Reason for Co-Treatment: For patient/therapist safety;To address functional/ADL transfers   OT goals addressed during session: ADL's and self-care      AM-PAC OT "6 Clicks" Daily Activity     Outcome Measure Help from another person eating meals?: None Help from another person taking care of personal grooming?: A Little Help from another person toileting, which includes using toliet, bedpan, or urinal?: A Lot Help from another person bathing (including washing, rinsing, drying)?: A Lot Help from another person to put on and taking off regular upper body clothing?: A Little Help from another person to put on and taking off regular lower body clothing?: A Lot 6 Click Score: 16   End of Session Equipment Utilized During Treatment: Gait belt;Rolling walker (2 wheels);Back brace Nurse Communication: Mobility status  Activity Tolerance: Patient tolerated treatment well Patient left: in bed;with call bell/phone within reach;with family/visitor present  OT Visit Diagnosis: Unsteadiness on feet (R26.81);Other abnormalities of gait and mobility (R26.89);Muscle weakness (generalized) (M62.81);History of falling (Z91.81);Pain                Time: 4098-1191 OT Time Calculation (min): 32 min Charges:  OT General Charges $OT Visit: 1 Visit OT Evaluation $OT Eval Moderate Complexity:  1 Mod   Chrystle Murillo A Chaylee Ehrsam 11/18/2021, 10:22 AM

## 2021-11-18 NOTE — Evaluation (Signed)
Physical Therapy Evaluation Patient Details Name: Clinton Garza MRN: [id number] DOB: [birth year] Today's Date: 11/18/2021  History of Present Illness  Pt is an 86 y/o male who presents s/p L4-L5 PLIF on 11/17/21. PMH significant for CAD, HTN, old MI.  Clinical Impression  Pt admitted with above diagnosis. At the time of PT eval, pt was able to demonstrate transfers and ambulation with up to +2 mod assist and max cues for safety. Pt required increased cues for attention to task and was easily distracted. Pt was educated on precautions, brace application/wearing schedule, appropriate activity progression, and car transfer. Pt currently with functional limitations due to the deficits listed below (see PT Problem List). Pt will benefit from skilled PT to increase their independence and safety with mobility to allow discharge to the venue listed below.         Recommendations for follow up therapy are one component of a multi-disciplinary discharge planning process, led by the attending physician.  Recommendations may be updated based on patient status, additional functional criteria and insurance authorization.  Follow Up Recommendations Home health PT    Assistance Recommended at Discharge Frequent or constant Supervision/Assistance  Patient can return home with the following  A lot of help with walking and/or transfers;A lot of help with bathing/dressing/bathroom;Help with stairs or ramp for entrance;Assist for transportation    Equipment Recommendations BSC/3in1  Recommendations for Other Services       Functional Status Assessment Patient has had a recent decline in their functional status and demonstrates the ability to make significant improvements in function in a reasonable and predictable amount of time.     Precautions / Restrictions Precautions Precautions: Back;Fall Precaution Booklet Issued: Yes (comment) Precaution Comments: Reviewed precautions verbally throughout session due  to pt attention, however handout left in room. Required Braces or Orthoses: Spinal Brace Spinal Brace: Lumbar corset;Applied in sitting position Restrictions Weight Bearing Restrictions: No      Mobility  Bed Mobility Overal bed mobility: Needs Assistance Bed Mobility: Rolling, Sidelying to Sit, Sit to Sidelying Rolling: Min assist Sidelying to sit: Mod assist     Sit to sidelying: Mod assist, +2 for physical assistance, +2 for safety/equipment General bed mobility comments: HOB lowered and rails lowered to simulate home environment. Max cues required throughout and pt impulsive with poor technique despite cues.    Transfers Overall transfer level: Needs assistance Equipment used: Rolling walker (2 wheels) Transfers: Sit to/from Stand Sit to Stand: Mod assist, +2 physical assistance, +2 safety/equipment, Max assist           General transfer comment: mod A for physical assistance, max A for verbal cues for hand placement. pt seemingly ignoring cues for safe hand placement for each transfer wtih RW despite education and max multimodal cues. Pulling to stand from center bar of RW and therapist assist to keep RW from tipping.    Ambulation/Gait Ambulation/Gait assistance: Mod assist, +2 safety/equipment Gait Distance (Feet): 125 Feet Assistive device: Rolling walker (2 wheels) Gait Pattern/deviations: Step-through pattern, Decreased dorsiflexion - right, Decreased dorsiflexion - left, Knee flexed in stance - right, Knee flexed in stance - left, Trunk flexed (Low floor clearance) Gait velocity: Decreased Gait velocity interpretation: <1.31 ft/sec, indicative of household ambulator   General Gait Details: Multimodal cues throughout for improved posture, closer walker proximity, and foward gaze. Pt talking almost constantly throughout gait training and required increased cues to make any corrective changes. Easily distracted.  Stairs  Wheelchair Mobility     Modified Rankin (Stroke Patients Only)       Balance Overall balance assessment: Needs assistance, History of Falls Sitting-balance support: Feet supported Sitting balance-Leahy Scale: Fair     Standing balance support: Bilateral upper extremity supported, Reliant on assistive device for balance Standing balance-Leahy Scale: Poor                               Pertinent Vitals/Pain Pain Assessment Pain Assessment: Faces Faces Pain Scale: Hurts a little bit Pain Location: back/sx site Pain Descriptors / Indicators: Discomfort, Grimacing Pain Intervention(s): Limited activity within patient's tolerance, Monitored during session, Repositioned    Home Living Family/patient expects to be discharged to:: Private residence Living Arrangements: Spouse/significant other Available Help at Discharge: Family;Available 24 hours/day Type of Home: House Home Access: Stairs to enter   [employer] Corporation[employer]trance Stairs-Number of Steps: 3   Home Layout: One level Home Equipment: Agricultural consultantolling Walker (2 wheels);Standard Walker      Prior Function Prior Level of Function : Driving;Needs assist       Physical Assist : Mobility (physical);ADLs (physical) Mobility (physical): Bed mobility;Transfers;Gait;Stairs ADLs (physical): Grooming;Dressing;Bathing;Toileting;IADLs Mobility Comments: Walks with a RW, has had falls with rollator. Family assists with transfers as needed ADLs Comments: Sleeps in recliner, family assists with LB ADLs as needed. Sponge bathes at baseline. Drives.     Hand Dominance   Dominant Hand: Right    Extremity/Trunk Assessment   Upper Extremity Assessment Upper Extremity Assessment: Defer to OT evaluation    Lower Extremity Assessment Lower Extremity Assessment: Generalized weakness (Consistent with pre-op diagnosis)    Cervical / Trunk Assessment Cervical / Trunk Assessment: Back Surgery  Communication   Communication: No difficulties  Cognition  Arousal/Alertness: Awake/alert Behavior During Therapy: Impulsive Overall Cognitive Status: Impaired/Different from baseline Area of Impairment: Attention, Following commands, Memory, Safety/judgement, Awareness, Problem solving                   Current Attention Level: Selective Memory: Decreased recall of precautions Following Commands: Follows one step commands with increased time Safety/Judgement: Decreased awareness of safety, Decreased awareness of deficits Awareness: Intellectual Problem Solving: Requires verbal cues General Comments: Pt impulsive with poor safety awareness during transfers. Required max cues for hand placement with RW, ignored cues and transferred unsafely each time. "I know how I do it at home." Wife states he does not follow cues well at home at baseline. Pt unable to multitask, and is self-distracting.        General Comments General comments (skin integrity, edema, etc.): VSS on RA, pt's wife and son present    Exercises     Assessment/Plan    PT Assessment Patient needs continued PT services  PT Problem List Decreased strength;Decreased activity tolerance;Decreased balance;Decreased mobility;Decreased knowledge of use of DME;Decreased safety awareness;Decreased knowledge of precautions;Pain       PT Treatment Interventions DME instruction;Gait training;Stair training;Functional mobility training;Therapeutic exercise;Therapeutic activities;Neuromuscular re-education;Balance training;Cognitive remediation;Patient/family education    PT Goals (Current goals can be found in the Care Plan section)  Acute Rehab PT Goals Patient Stated Goal: Get back on his tractor PT Goal Formulation: With patient/family Time For Goal Achievement: 11/25/21 Potential to Achieve Goals: Good    Frequency Min 5X/week     Co-evaluation PT/OT/SLP Co-Evaluation/Treatment: Yes Reason for Co-Treatment: Complexity of the patient's impairments (multi-system  involvement);For patient/therapist safety;To address functional/ADL transfers PT goals addressed during session: Mobility/safety with mobility;Balance;Proper use of DME  OT goals addressed during session: ADL's and self-care       AM-PAC PT "6 Clicks" Mobility  Outcome Measure Help needed turning from your back to your side while in a flat bed without using bedrails?: A Little Help needed moving from lying on your back to sitting on the side of a flat bed without using bedrails?: A Lot Help needed moving to and from a bed to a chair (including a wheelchair)?: A Lot Help needed standing up from a chair using your arms (e.g., wheelchair or bedside chair)?: Total Help needed to walk in hospital room?: A Lot Help needed climbing 3-5 steps with a railing? : Total 6 Click Score: 11    End of Session Equipment Utilized During Treatment: Gait belt;Back brace Activity Tolerance: Patient tolerated treatment well Patient left: in bed;with call bell/phone within reach;with family/visitor present Nurse Communication: Mobility status PT Visit Diagnosis: Unsteadiness on feet (R26.81);Pain Pain - part of body:  (back)    Time: 5409-8119 PT Time Calculation (min) (ACUTE ONLY): 32 min   Charges:   PT Evaluation $PT Eval Low Complexity: 1 Low          Conni Slipper, PT, DPT Acute Rehabilitation Services Pager: [phone] Office: [phone]   ANDRIK SANDT 11/18/2021, 11:20 AM

## 2021-11-19 MED ORDER — METHOCARBAMOL 500 MG PO TABS: 500.0000 mg | ORAL_TABLET | Freq: Four times a day (QID) | ORAL | 2 refills | Status: DC | PRN

## 2021-11-19 MED ORDER — TRAMADOL HCL 50 MG PO TABS: 50.0000 mg | ORAL_TABLET | Freq: Four times a day (QID) | ORAL | 0 refills | Status: AC | PRN

## 2021-11-19 NOTE — Plan of Care (Signed)
Adequately Ready for discharge °

## 2021-11-19 NOTE — Progress Notes (Signed)
Pt. discharged home accompanied by husband. Prescriptions and discharge instructions given with verbalization of understanding. Incision dressing was removed paint with betadine and site on back with no s/s of infection - no swelling, redness, bleeding, and/or drainage noted. Pt.transported out of this unit in wheelchair by unit staff.

## 2021-11-19 NOTE — Progress Notes (Signed)
Physical Therapy Treatment Patient Details Name: Clinton Garza MRN: [id number] DOB: [birth year] Today's Date: 11/19/2021   History of Present Illness Pt is an 86 y/o male who presents s/p L4-L5 PLIF on 11/17/21. PMH significant for CAD, HTN, old MI.    PT Comments    Pt progressing well with post-op mobility. He was able to demonstrate transfers and ambulation with gross min-mod assist and RW for support. Pt completed stair training and was able to negotiate 3 stairs with min assist. Pt was educated on precautions, brace application/wearing schedule, appropriate activity progression, and car transfer. Wife and daughter present for education as well. Will continue to follow.      Recommendations for follow up therapy are one component of a multi-disciplinary discharge planning process, led by the attending physician.  Recommendations may be updated based on patient status, additional functional criteria and insurance authorization.  Follow Up Recommendations  Home health PT     Assistance Recommended at Discharge Frequent or constant Supervision/Assistance  Patient can return home with the following A lot of help with walking and/or transfers;A lot of help with bathing/dressing/bathroom;Help with stairs or ramp for entrance;Assist for transportation   Equipment Recommendations  BSC/3in1    Recommendations for Other Services       Precautions / Restrictions Precautions Precautions: Back;Fall Precaution Booklet Issued: Yes (comment) Precaution Comments: Reviewed precautions verbally throughout session due to pt attention, however handout left in room. Required Braces or Orthoses: Spinal Brace Spinal Brace: Lumbar corset;Applied in sitting position Restrictions Weight Bearing Restrictions: No     Mobility  Bed Mobility Overal bed mobility: Needs Assistance Bed Mobility: Rolling, Sidelying to Sit Rolling: Mod assist Sidelying to sit: Min assist       General bed mobility  comments: Assist required for log roll technique. Pt with difficulty initiating roll and achieving full sidelying position before elevating trunk.    Transfers Overall transfer level: Needs assistance Equipment used: Rolling walker (2 wheels) Transfers: Sit to/from Stand Sit to Stand: Mod assist           General transfer comment: VC's for hand placement on seated surface for safety. Pt was able to power up to full stand with mod assist for balance support and safety.    Ambulation/Gait Ambulation/Gait assistance: Min assist Gait Distance (Feet): 120 Feet Assistive device: Rolling walker (2 wheels) Gait Pattern/deviations: Step-through pattern, Decreased dorsiflexion - right, Decreased dorsiflexion - left, Knee flexed in stance - right, Knee flexed in stance - left, Trunk flexed (Low floor clearance) Gait velocity: Decreased Gait velocity interpretation: <1.31 ft/sec, indicative of household ambulator   General Gait Details: Multimodal cues throughout for improved posture, closer walker proximity, and foward gaze. Frequent assist for balance support and walker management.   Stairs Stairs: Yes Stairs assistance: Min assist Stair Management: Two rails, Step to pattern, Forwards Number of Stairs: 3 General stair comments: VC's for sequencing and general safety. Assist for boost up to next step and controlled lower to descend.   Wheelchair Mobility    Modified Rankin (Stroke Patients Only)       Balance Overall balance assessment: Needs assistance, History of Falls Sitting-balance support: Feet supported Sitting balance-Leahy Scale: Fair     Standing balance support: Bilateral upper extremity supported, Reliant on assistive device for balance Standing balance-Leahy Scale: Poor                              Cognition Arousal/Alertness: Awake/alert Behavior During  Therapy: Flat affect, WFL for tasks assessed/performed Overall Cognitive Status:  Impaired/Different from baseline Area of Impairment: Attention, Memory, Following commands, Safety/judgement, Awareness, Problem solving                   Current Attention Level: Selective Memory: Decreased recall of precautions Following Commands: Follows one step commands consistently Safety/Judgement: Decreased awareness of deficits Awareness: Emergent Problem Solving: Requires verbal cues          Exercises      General Comments General comments (skin integrity, edema, etc.): VSS on RA, no family present      Pertinent Vitals/Pain Pain Assessment Pain Assessment: Faces Faces Pain Scale: Hurts little more Pain Location: back/sx site Pain Descriptors / Indicators: Discomfort, Grimacing Pain Intervention(s): Limited activity within patient's tolerance, Monitored during session, Repositioned    Home Living                          Prior Function            PT Goals (current goals can now be found in the care plan section) Acute Rehab PT Goals Patient Stated Goal: Get back on his tractor PT Goal Formulation: With patient/family Time For Goal Achievement: 11/25/21 Potential to Achieve Goals: Good Progress towards PT goals: Progressing toward goals    Frequency    Min 5X/week      PT Plan Current plan remains appropriate    Co-evaluation              AM-PAC PT "6 Clicks" Mobility   Outcome Measure  Help needed turning from your back to your side while in a flat bed without using bedrails?: A Lot Help needed moving from lying on your back to sitting on the side of a flat bed without using bedrails?: A Lot Help needed moving to and from a bed to a chair (including a wheelchair)?: A Little Help needed standing up from a chair using your arms (e.g., wheelchair or bedside chair)?: A Lot Help needed to walk in hospital room?: A Little Help needed climbing 3-5 steps with a railing? : A Lot 6 Click Score: 14    End of Session Equipment  Utilized During Treatment: Gait belt;Back brace Activity Tolerance: Patient tolerated treatment well Patient left: in bed;with call bell/phone within reach;with family/visitor present Nurse Communication: Mobility status PT Visit Diagnosis: Unsteadiness on feet (R26.81);Pain Pain - part of body:  (back)     Time: 0973-5329 PT Time Calculation (min) (ACUTE ONLY): 27 min  Charges:  $Gait Training: 23-37 mins                     Conni Slipper, PT, DPT Acute Rehabilitation Services Pager: [phone] Office: [phone]    Clinton Garza 11/19/2021, 10:59 AM

## 2021-11-19 NOTE — Discharge Summary (Signed)
Physician Discharge Summary  Patient ID: Gurvinder Agosto MRN: [id number] DOB/AGE: May 09, 1936 86 y.o.  Admit date: 11/17/2021 Discharge date: 11/19/2021  Admission Diagnoses: Lumbar spinal stenosis L4-L5, spondylolisthesis.  Neurogenic claudication.  Discharge Diagnoses: Lumbar spinal stenosis L4-L5.  Spondylolisthesis L4-L5.  Neurogenic claudication. Principal Problem:   Spondylolisthesis of lumbar region   Discharged Condition: fair  Hospital Course: Patient was admitted to undergo surgical decompression arthrodesis at the L4-L5 level.  He tolerated surgery well.  Consults: None  Significant Diagnostic Studies: None  Treatments: surgery: See op note  Discharge Exam: Blood pressure (!) 104/58, pulse 66, temperature 98 F (36.7 C), resp. rate 18, height 5\' 7"  (1.702 m), weight 88.5 kg, SpO2 96 %. Incision is clean and dry motor function is intact in lower extremities.  Disposition: Discharge disposition: 01-Home or Self Care       Discharge Instructions     Call MD for:  redness, tenderness, or signs of infection (pain, swelling, redness, odor or green/yellow discharge around incision site)   Complete by: As directed    Call MD for:  severe uncontrolled pain   Complete by: As directed    Call MD for:  temperature >100.4   Complete by: As directed    Diet - low sodium heart healthy   Complete by: As directed    Discharge wound care:   Complete by: As directed    Okay to shower. Do not apply salves or appointments to incision. No heavy lifting with the upper extremities greater than 10 pounds. May resume driving when not requiring pain medication and patient feels comfortable with doing so.   Incentive spirometry RT   Complete by: As directed    Increase activity slowly   Complete by: As directed       Allergies as of 11/19/2021       Reactions   Lipitor [atorvastatin] Hives   Antivert [meclizine Hcl]    Pt does not remember reaction    Iodinated Contrast Media     Itching, turned red         Medication List     TAKE these medications    acetaminophen 500 MG tablet Commonly known as: TYLENOL Take 1,000 mg by mouth in the morning and at bedtime.   aspirin 81 MG tablet Take 81 mg by mouth daily.   hydrochlorothiazide 25 MG tablet Commonly known as: HYDRODIURIL TAKE 1 TABLET BY MOUTH EVERY DAY   Hydrocortisone-Iodoquinol 1-1 % Crea Place 1 application onto the skin 2 (two) times daily as needed for rash.   Klor-Con M10 10 MEQ tablet Generic drug: potassium chloride TAKE 1 TABLET BY MOUTH EVERY DAY   lisinopril 20 MG tablet Commonly known as: ZESTRIL TAKE 1 TABLET BY MOUTH EVERY DAY   meloxicam 7.5 MG tablet Commonly known as: MOBIC Take 7.5 mg by mouth at bedtime.   methocarbamol 500 MG tablet Commonly known as: ROBAXIN Take 1 tablet (500 mg total) by mouth every 6 (six) hours as needed for muscle spasms.   naproxen sodium 220 MG tablet Commonly known as: ALEVE Take 440 mg by mouth 2 (two) times daily as needed (pain).   nitroGLYCERIN 0.4 MG SL tablet Commonly known as: NITROSTAT Place 1 tablet (0.4 mg total) under the tongue every 5 (five) minutes as needed.   oxyCODONE-acetaminophen 5-325 MG tablet Commonly known as: PERCOCET/ROXICET Take by mouth every 4 (four) hours as needed for severe pain.   PRESERVISION AREDS 2+MULTI VIT PO Take 1 capsule by mouth in the morning  and at bedtime.   rosuvastatin 10 MG tablet Commonly known as: CRESTOR TAKE 1 TABLET BY MOUTH EVERY DAY   traMADol 50 MG tablet Commonly known as: Ultram Take 1 tablet (50 mg total) by mouth every 6 (six) hours as needed.               Discharge Care Instructions  (From admission, onward)           Start     Ordered   11/19/21 0000  Discharge wound care:       Comments: Okay to shower. Do not apply salves or appointments to incision. No heavy lifting with the upper extremities greater than 10 pounds. May resume driving when not  requiring pain medication and patient feels comfortable with doing so.   11/19/21 0922            Follow-up Information     [facility].   Specialty: [place] [facility]: also known as Psychiatrist information: [place] [phone]                 Signed: Earleen Newport 11/19/2021, 9:23 AM

## 2021-11-19 NOTE — Progress Notes (Signed)
Occupational Therapy Treatment Patient Details Name: Clinton Garza MRN: [id number] DOB: [birth year] Today's Date: 11/19/2021   History of present illness Pt is an 86 y/o male who presents s/p L4-L5 PLIF on 11/17/21. PMH significant for CAD, HTN, old MI.   OT comments  [name] is progressing well, he had improved RW management and knowledge of back precautions this session. He continues to require verbal cues for safety and compensatory techniques, but followed all commands well this session without impulsivity. Pt was able to transfer from the bed and ambulate to the bathroom for a toilet transfer with min A overall. He recalled 2/3 back precautions and 3/3 after review, and was self-correcting with use of DME "stand tall" "stay close to the walker" & "I need to reach back." Pt continues to benefit from OT acutely. D/c recommendation remains appropriate.    Recommendations for follow up therapy are one component of a multi-disciplinary discharge planning process, led by the attending physician.  Recommendations may be updated based on patient status, additional functional criteria and insurance authorization.    Follow Up Recommendations  Home health OT    Assistance Recommended at Discharge Frequent or constant Supervision/Assistance  Patient can return home with the following  A lot of help with walking and/or transfers;A lot of help with bathing/dressing/bathroom;Assist for transportation;Help with stairs or ramp for entrance   Equipment Recommendations  BSC/3in1       Precautions / Restrictions Precautions Precautions: Back;Fall Precaution Booklet Issued: Yes (comment) Precaution Comments: Reviewed precautions verbally throughout session due to pt attention, however handout left in room. Required Braces or Orthoses: Spinal Brace Spinal Brace: Lumbar corset;Applied in sitting position Restrictions Weight Bearing Restrictions: No       Mobility Bed Mobility Overal bed mobility:  Needs Assistance Bed Mobility: Rolling, Sidelying to Sit Rolling: Min assist Sidelying to sit: Min assist       General bed mobility comments: HOB eleated, verbal cues to log roll    Transfers Overall transfer level: Needs assistance Equipment used: Rolling walker (2 wheels) Transfers: Sit to/from Stand Sit to Stand: Min assist           General transfer comment: slightly elevated surface, verbal cues for hand placement +increased time & effort     Balance Overall balance assessment: Needs assistance, History of Falls Sitting-balance support: Feet supported Sitting balance-Leahy Scale: Fair     Standing balance support: Bilateral upper extremity supported, Reliant on assistive device for balance Standing balance-Leahy Scale: Poor                             ADL either performed or assessed with clinical judgement   ADL Overall ADL's : Needs assistance/impaired     Grooming: Supervision/safety;Standing Grooming Details (indicate cue type and reason): verbal cues for grooming at the sink - educated on compensatory techniques                 Toilet Transfer: Minimal assistance;Ambulation;BSC/3in1;Rolling walker (2 wheels) Toilet Transfer Details (indicate cue type and reason): BSC over toilet. min A to power into standingand for verbal cues for RW management         Functional mobility during ADLs: Minimal assistance;Rolling walker (2 wheels) General ADL Comments: pt was self correcting with RW telling himself to "stay close" and "stand tall"    Extremity/Trunk Assessment Upper Extremity Assessment Upper Extremity Assessment: Generalized weakness   Lower Extremity Assessment Lower Extremity Assessment: Defer to PT evaluation  Vision   Vision Assessment?: No apparent visual deficits   Perception Perception Perception: Within Functional Limits   Praxis Praxis Praxis: Intact    Cognition Arousal/Alertness: Awake/alert Behavior  During Therapy: Flat affect, WFL for tasks assessed/performed Overall Cognitive Status: Impaired/Different from baseline Area of Impairment: Attention, Memory, Following commands, Safety/judgement, Awareness, Problem solving                   Current Attention Level: Selective Memory: Decreased recall of precautions Following Commands: Follows one step commands consistently Safety/Judgement: Decreased awareness of deficits Awareness: Emergent Problem Solving: Requires verbal cues General Comments: pt was not as impulsive this session, and followed all cues well with incrased time. He recalled 2/3 back precautions and 3/3 after review. He demonstrated good RW management when given min cues.        Exercises      Shoulder Instructions       General Comments VSS on RA, no family present    Pertinent Vitals/ Pain       Pain Assessment Pain Assessment: Faces Faces Pain Scale: Hurts little more Pain Location: back/sx site Pain Descriptors / Indicators: Discomfort, Grimacing Pain Intervention(s): Monitored during session, Limited activity within patient's tolerance   Frequency  Min 2X/week        Progress Toward Goals  OT Goals(current goals can now be found in the care plan section)  Progress towards OT goals: Progressing toward goals  Acute Rehab OT Goals Patient Stated Goal: home today OT Goal Formulation: With patient Time For Goal Achievement: 12/02/21 Potential to Achieve Goals: Good ADL Goals Pt Will Perform Grooming: with supervision;standing Pt Will Perform Lower Body Bathing: with min assist;sit to/from stand Pt Will Perform Lower Body Dressing: with min assist;sit to/from stand Pt Will Transfer to Toilet: with min guard assist;ambulating Additional ADL Goal #1: pt will indep verbalize 3/3 back precautions as a precursor for safe ADLs Additional ADL Goal #2: pt will indep complete bed mobility with log roll as a precursor to ADLs  Plan Discharge plan  remains appropriate       AM-PAC OT "6 Clicks" Daily Activity     Outcome Measure   Help from another person eating meals?: None Help from another person taking care of personal grooming?: A Little Help from another person toileting, which includes using toliet, bedpan, or urinal?: A Little Help from another person bathing (including washing, rinsing, drying)?: A Lot Help from another person to put on and taking off regular upper body clothing?: A Little Help from another person to put on and taking off regular lower body clothing?: A Lot 6 Click Score: 17    End of Session Equipment Utilized During Treatment: Gait belt;Rolling walker (2 wheels);Back brace  OT Visit Diagnosis: Unsteadiness on feet (R26.81);Other abnormalities of gait and mobility (R26.89);Muscle weakness (generalized) (M62.81);History of falling (Z91.81);Pain   Activity Tolerance Patient tolerated treatment well   Patient Left in bed;with call bell/phone within reach;with family/visitor present   Nurse Communication Mobility status        Time: 1941-7408 OT Time Calculation (min): 21 min  Charges: OT General Charges $OT Visit: 1 Visit OT Treatments $Self Care/Home Management : 8-22 mins   Shmiel Morton A Latesa Fratto 11/19/2021, 9:30 AM

## 2021-11-20 ENCOUNTER — Other Ambulatory Visit (HOSPITAL_COMMUNITY): Payer: Medicare Other

## 2021-12-20 ENCOUNTER — Other Ambulatory Visit: Payer: Self-pay | Admitting: Cardiovascular Disease

## 2022-07-24 ENCOUNTER — Telehealth: Payer: Self-pay

## 2022-07-24 MED ORDER — NITROGLYCERIN 0.4 MG SL SUBL: SUBLINGUAL_TABLET | SUBLINGUAL | 1 refills | Status: DC

## 2022-07-24 NOTE — Telephone Encounter (Signed)
Patient's wife sent MyChart message requesting refill on his nitroglycerin. Advised that he needs an appointment soon. Will send refill in at this time.

## 2022-08-25 ENCOUNTER — Other Ambulatory Visit: Payer: Self-pay | Admitting: Cardiovascular Disease

## 2022-08-30 ENCOUNTER — Encounter: Payer: Self-pay | Admitting: Cardiovascular Disease

## 2022-08-30 NOTE — Progress Notes (Unsigned)
Cardiology Office No   Date:  08/31/2022   ID:  Clinton Garza, DOB Sep 03, 1936, MRN [id number]  PCP:  Levy Sjogren, NP  Cardiologist:   Kristeen Miss, MD    Chief Complaint  Patient presents with   Coronary Artery Disease        Hypertension           Clinton Garza is a 86 y.o. male who presents for   1. Coronary artery disease-status post anterior wall myocardial infarction in 1996. He status post PTCA of the LAD 2. Hyperlipidemia   Clinton Garza is a 86 year old gentleman with a history of coronary artery disease. He status post anterior wall myocardial infarction in 1996. We performed PTCA of his left anterior descending artery.  A stress Myoview study performed in 2000 it reveals anteroapical scar but no evidence of ischemia. His left ventricular systolic function was normal at 56%. An echocardiogram performed in May of 2001 reveals an ejection fraction between 55 and 60%. He has hypokinesis and calcification of the apex at the site of his old apical myocardial infarction.  He has gained some weight.  No chest pain.   Oct. 3, 2013 - He has been busy - he bought a house at [facility] and has been remodeling the house.  He has not been getting as much exercise as he would like.  Oct. 22, 2014    Clinton Garza is doing well.  Still working on his [employer].    He still eats some salt.   He denies any angina.   He was placed on atorvastatin 80 mg a day. Unfortunately, he broke out with a drug rash several days after taking medication. He discontinued the Lipitor at  that time.    April 24, 2014: Clinton Garza is doing well.  No CP.  Still eating some salt.  Does have some DOe if he walks too fast.    Jan. 21, 2016:    Clinton Garza is doing well.  Has gained some weight - spine issues.  Still eats a little extra salt - Does not check his BP at home.   Jan. 23, 2017:  Clinton Garza is seen today for follow up of his CAD, HTN and hyperlipidemia. He has lost about 20 lbs over the  past several months.  Has had some vertigo symptoms.    Went to the ER , found to have hypertensive,    Was restarted back on HCTZ and potassium .  BP is well controlled this am No CP or dyspnea.   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   No CP or dyspnea.    Had back injections this past Monday - has helped his back pain .  Feb. 14, 2018:  No CP or dyspnea.  Having back pain . Has gained some weight .  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 [place] to [facility] [place]    07/23/2017 Clinton Garza is seen today for follow-up visit. He's done well. He's not had any episodes of chest pain or shortness of breath. Has been gaining some weight.   He's not able to walk as much as he would like because of some back issues.  January 24, 2018:   Clinton Garza is seen today for follow up of his CAD, HTN and hyperlipidemia  Needs to exercise more,  Has back pain  [place] [facility] [place]   July 20, 2018:  Clinton Garza is seen today for follow-up of his coronary artery disease and hyperlipidemia.  He has had some recent knee surgery and is walks with a cane. Has knee swelling  Has wet macular degeneration. Doing well from a cardiac standpoint .  August 25, 2019: Clinton Garza is seen today for follow-up of his coronary artery disease and hyperlipidemia. Last Weight  Most recent update: 08/25/2019  8:18 AM    Weight  93.6 kg (206 lb 4 oz)            Is short of breath.   Has not been exercising at all due to lack of exercise (back surgery and knee surgery have limited his walking )  No CP.   We discussed at length  Nov. 4, 2021;  Clinton Garza is seen today for follow up of his CAD and HLD. Had back surgery .   Still having lots of pain .  Due to have another back injection .    August 26, 2021: Clinton Garza is seen for follow-up of his coronary artery disease, hyperlipidemia. Still having leg and back pain .  Had a squamous cell cancer removed  No CP   Has fully retired.   Nov. 6, 2023 Clinton Garza is seen for follow up of his CAD, HLD  No cardiac issues ,   Had back surgery in Januray.  Still recovering  Hx of HLD . Labs from last year look ok    Is not eating as much , has lost weight and feels better from that standpoint .    Past Medical History:  Diagnosis Date   Arthritis    Coronary artery disease    Difficult intubation    Dyslipidemia    GERD (gastroesophageal reflux disease)    no meds, diet controlled   History of kidney stones    passed surgery   Hypertension    MI, old    anterior wall   Mixed hyperlipidemia 07/23/2017    Past Surgical History:  Procedure Laterality Date   BACK SURGERY     CARDIAC CATHETERIZATION  05/11/1995   normal left ventricular systolic function with ef of 70-75%   COLONOSCOPY     EYE SURGERY Bilateral    cataracts removed   NECK SURGERY       Current Outpatient Medications  Medication Sig Dispense Refill   acetaminophen (TYLENOL) 500 MG tablet Take 1,000 mg by mouth in the morning and at bedtime.     aspirin 81 MG tablet Take 81 mg by mouth daily.       hydrochlorothiazide (HYDRODIURIL) 25 MG tablet Take 1 tablet (25 mg total) by mouth daily. Please keep upcoming appointment for future refills. Thank you. 30 tablet 0   Iodoquinol-HC (HYDROCORTISONE-IODOQUINOL) 1-1 % CREA Place 1 application onto the skin 2 (two) times daily as needed for rash.  1   lisinopril (ZESTRIL) 20 MG tablet TAKE 1 TABLET BY MOUTH EVERY DAY. Please keep upcoming appointment for future refills. Thank you. 30 tablet 0   meloxicam (MOBIC) 7.5 MG tablet Take 7.5 mg by mouth at bedtime.     Multiple Vitamins-Minerals (PRESERVISION AREDS 2+MULTI VIT PO) Take 1 capsule by mouth in the morning and at bedtime.     nitroGLYCERIN (NITROSTAT) 0.4 MG SL tablet Dissolve 1 tablet under the tongue every 5 minutes as needed for chest pain. Max of 3 doses, then 911. 25 tablet 1   oxyCODONE-acetaminophen (PERCOCET/ROXICET)  5-325 MG tablet Take by mouth every 4 (four) hours as needed for severe  pain.     potassium chloride (KLOR-CON M10) 10 MEQ tablet Take 1 tablet (10 mEq total) by mouth daily. Please keep upcoming appointment for future refills. Thank you. 90 tablet 0   rosuvastatin (CRESTOR) 10 MG tablet TAKE 1 TABLET BY MOUTH EVERY DAY 90 tablet 3   traMADol (ULTRAM) 50 MG tablet Take 1 tablet (50 mg total) by mouth every 6 (six) hours as needed. 30 tablet 0   methocarbamol (ROBAXIN) 500 MG tablet Take 1 tablet (500 mg total) by mouth every 6 (six) hours as needed for muscle spasms. (Patient not taking: Reported on 08/31/2022) 30 tablet 2   naproxen sodium (ALEVE) 220 MG tablet Take 440 mg by mouth 2 (two) times daily as needed (pain). (Patient not taking: Reported on 08/31/2022)     No current facility-administered medications for this visit.    Allergies:   Lipitor [atorvastatin], Antivert [meclizine hcl], and Iodinated contrast media    Social History:  The patient  reports that he has never smoked. He has never used smokeless tobacco. He reports that he does not drink alcohol and does not use drugs.   Family History:  The patient's family history includes Cancer in his mother; Heart Problems in his father.    ROS:  Please see the history of present illness.   Otherwise, review of systems are positive for none.   All other systems are reviewed and negative.    Physical Exam: Blood pressure (!) 118/58, pulse 61, height 5\' 7"  (1.702 m), weight 181 lb 9.6 oz (82.4 kg), SpO2 95 %.       GEN:  Well nourished, well developed in no acute distress HEENT: Normal NECK: No JVD; No carotid bruits LYMPHATICS: No lymphadenopathy CARDIAC: RRR , no murmurs, rubs, gallops RESPIRATORY:  Clear to auscultation without rales, wheezing or rhonchi  ABDOMEN: Soft, non-tender, non-distended MUSCULOSKELETAL:  No edema; No deformity  SKIN: Warm and dry NEUROLOGIC:  Alert and oriented x 3    EKG:      NSR at 61.  ,  minimal voltage criteria for LVH    Recent Labs: 11/18/2021: BUN 22; Creatinine, Ser 1.52; Hemoglobin 12.3; Platelets 74; Potassium 4.3; Sodium 133    Lipid Panel    Component Value Date/Time   CHOL 115 08/26/2021 1041   TRIG 189 (H) 08/26/2021 1041   HDL 29 (L) 08/26/2021 1041   CHOLHDL 4.0 08/26/2021 1041   CHOLHDL 3.6 09/15/2016 0738   VLDL 23 09/15/2016 0738   LDLCALC 55 08/26/2021 1041      Wt Readings from Last 3 Encounters:  08/31/22 181 lb 9.6 oz (82.4 kg)  11/17/21 195 lb (88.5 kg)  08/26/21 204 lb 9.6 oz (92.8 kg)      Other studies Reviewed: Additional studies/ records that were reviewed today include: . Review of the above records demonstrates:    ASSESSMENT AND PLAN:  1.   Coronary artery disease:    no angina ,  continue to follow      2. Hypertension:     BP is well controlled.   3. Hyperlipidemia:       Lipids looked good last year Will recheck tomorrow      Will have him follow up in a year.    Current medicines are reviewed at length with the patient today.  The patient does not have concerns regarding medicines.  The following changes have been made:  None   Labs/ tests ordered today include:   Orders Placed This Encounter  Procedures   ALT   Lipid panel   Basic metabolic panel   EKG 12-Lead      Disposition:      Signed, Kristeen Miss, MD  08/31/2022 11:58 AM    [facility] [address] [place], [place], [place]  [address] Phone: [phone]; Fax: [phone]

## 2022-08-31 ENCOUNTER — Ambulatory Visit: Payer: Medicare Other | Attending: Cardiovascular Disease | Admitting: Cardiovascular Disease

## 2022-08-31 ENCOUNTER — Encounter: Payer: Self-pay | Admitting: Cardiovascular Disease

## 2022-08-31 VITALS — BP 118/58 | HR 61 | Ht 67.0 in | Wt 181.6 lb

## 2022-08-31 DIAGNOSIS — I2510 Atherosclerotic heart disease of native coronary artery without angina pectoris: Secondary | ICD-10-CM | POA: Diagnosis not present

## 2022-08-31 DIAGNOSIS — I10 Essential (primary) hypertension: Secondary | ICD-10-CM

## 2022-08-31 DIAGNOSIS — E782 Mixed hyperlipidemia: Secondary | ICD-10-CM

## 2022-08-31 NOTE — Patient Instructions (Signed)
Medication Instructions:  Your physician recommends that you continue on your current medications as directed. Please refer to the Current Medication list given to you today.  *If you need a refill on your cardiac medications before your next appointment, please call your pharmacy*   Lab Work: Lipids, ALT, BMET tomorrow If you have labs (blood work) drawn today and your tests are completely normal, you will receive your results only by: [place] (if you have MyChart) OR A paper copy in the mail If you have any lab test that is abnormal or we need to change your treatment, we will call you to review the results.   Testing/Procedures: NONE   Follow-Up: At [facility], you and your health needs are our priority.  As part of our continuing mission to provide you with exceptional heart care, we have created designated Provider Care Teams.  These Care Teams include your primary Cardiologist (physician) and Advanced Practice Providers (APPs -  Physician Assistants and Nurse Practitioners) who all work together to provide you with the care you need, when you need it.  We recommend signing up for the patient portal called "MyChart".  Sign up information is provided on this After Visit Summary.  MyChart is used to connect with patients for Virtual Visits (Telemedicine).  Patients are able to view lab/test results, encounter notes, upcoming appointments, etc.  Non-urgent messages can be sent to your provider as well.   To learn more about what you can do with MyChart, go to [email or web address].    Your next appointment:   1 year(s)  The format for your next appointment:   In Person  Provider:   Mertie Moores, MD       Important Information About Sugar

## 2022-09-01 ENCOUNTER — Ambulatory Visit: Payer: Medicare Other | Attending: Cardiovascular Disease

## 2022-09-01 DIAGNOSIS — I10 Essential (primary) hypertension: Secondary | ICD-10-CM

## 2022-09-01 DIAGNOSIS — E782 Mixed hyperlipidemia: Secondary | ICD-10-CM

## 2022-09-01 DIAGNOSIS — I2510 Atherosclerotic heart disease of native coronary artery without angina pectoris: Secondary | ICD-10-CM

## 2022-09-02 LAB — BASIC METABOLIC PANEL
BUN/Creatinine Ratio: 17 (ref 10–24)
BUN: 22 mg/dL (ref 8–27)
CO2: 26 mmol/L (ref 20–29)
Calcium: 9.7 mg/dL (ref 8.6–10.2)
Chloride: 103 mmol/L (ref 96–106)
Creatinine, Ser: 1.3 mg/dL — ABNORMAL HIGH (ref 0.76–1.27)
Glucose: 99 mg/dL (ref 70–99)
Potassium: 4.4 mmol/L (ref 3.5–5.2)
Sodium: 141 mmol/L (ref 134–144)
eGFR: 54 mL/min/{1.73_m2} — ABNORMAL LOW (ref >59)

## 2022-09-02 LAB — LIPID PANEL
Chol/HDL Ratio: 3.4 ratio (ref 0.0–5.0)
Cholesterol, Total: 122 mg/dL (ref 100–199)
HDL: 36 mg/dL — ABNORMAL LOW (ref >39)
LDL Chol Calc (NIH): 69 mg/dL (ref 0–99)
Triglycerides: 89 mg/dL (ref 0–149)
VLDL Cholesterol Cal: 17 mg/dL (ref 5–40)

## 2022-09-02 LAB — ALT: ALT: 32 IU/L (ref 0–44)

## 2022-09-17 ENCOUNTER — Other Ambulatory Visit: Payer: Self-pay | Admitting: Cardiovascular Disease

## 2022-09-24 ENCOUNTER — Other Ambulatory Visit: Payer: Self-pay | Admitting: Cardiovascular Disease

## 2022-11-03 ENCOUNTER — Other Ambulatory Visit: Payer: Self-pay | Admitting: Cardiovascular Disease

## 2022-11-03 MED ORDER — POTASSIUM CHLORIDE CRYS ER 10 MEQ PO TBCR: 10.0000 meq | EXTENDED_RELEASE_TABLET | Freq: Every day | ORAL | 3 refills | Status: DC

## 2022-11-25 ENCOUNTER — Other Ambulatory Visit: Payer: Self-pay | Admitting: Cardiovascular Disease

## 2023-04-19 ENCOUNTER — Other Ambulatory Visit: Payer: Self-pay | Admitting: Otolaryngology

## 2023-04-19 DIAGNOSIS — H9012 Conductive hearing loss, unilateral, left ear, with unrestricted hearing on the contralateral side: Secondary | ICD-10-CM

## 2023-04-23 ENCOUNTER — Ambulatory Visit
Admission: RE | Admit: 2023-04-23 | Discharge: 2023-04-23 | Disposition: A | Discharge status: Home / Self Care | Payer: Medicare Other | Source: Ambulatory Visit | Attending: Otolaryngology | Admitting: Otolaryngology

## 2023-04-23 DIAGNOSIS — H9012 Conductive hearing loss, unilateral, left ear, with unrestricted hearing on the contralateral side: Secondary | ICD-10-CM

## 2023-08-16 ENCOUNTER — Ambulatory Visit: Payer: Medicare Other | Attending: Cardiovascular Disease | Admitting: Cardiovascular Disease

## 2023-08-16 ENCOUNTER — Encounter: Payer: Self-pay | Admitting: Cardiovascular Disease

## 2023-08-16 VITALS — BP 110/58 | HR 64 | Ht 67.0 in | Wt 182.6 lb

## 2023-08-16 DIAGNOSIS — I2510 Atherosclerotic heart disease of native coronary artery without angina pectoris: Secondary | ICD-10-CM

## 2023-08-16 DIAGNOSIS — R112 Nausea with vomiting, unspecified: Secondary | ICD-10-CM

## 2023-08-16 HISTORY — DX: Nausea with vomiting, unspecified: R11.2

## 2023-08-16 NOTE — Patient Instructions (Signed)
Medication Instructions:  Your physician recommends that you continue on your current medications as directed. Please refer to the Current Medication list given to you today.  *If you need a refill on your cardiac medications before your next appointment, please call your pharmacy*  Lab Work: If you have labs (blood work) drawn today and your tests are completely normal, you will receive your results only by: MyChart Message (if you have MyChart) OR A paper copy in the mail If you have any lab test that is abnormal or we need to change your treatment, we will call you to review the results.  Testing/Procedures: None ordered today.  Follow-Up: At [facility], you and your health needs are our priority.  As part of our continuing mission to provide you with exceptional heart care, we have created designated Provider Care Teams.  These Care Teams include your primary Cardiologist (physician) and Advanced Practice Providers (APPs -  Physician Assistants and Nurse Practitioners) who all work together to provide you with the care you need, when you need it.  We recommend signing up for the patient portal called "MyChart".  Sign up information is provided on this After Visit Summary.  MyChart is used to connect with patients for Virtual Visits (Telemedicine).  Patients are able to view lab/test results, encounter notes, upcoming appointments, etc.  Non-urgent messages can be sent to your provider as well.   To learn more about what you can do with MyChart, go to [email or web address].    Your next appointment:   6 month(s)  Provider:   Kristeen Miss, MD

## 2023-08-16 NOTE — Progress Notes (Signed)
Cardiology Office No   Date:  08/16/2023   ID:  Clinton Garza, DOB 07/17/1936, MRN [id number]  PCP:  Levy Sjogren, NP  Cardiologist:   Kristeen Miss, MD    No chief complaint on file.     Clinton Garza is a 87 y.o. male who presents for   1. Coronary artery disease-status post anterior wall myocardial infarction in 1996. He status post PTCA of the LAD 2. Hyperlipidemia   Clinton Garza is a 87 year old gentleman with a history of coronary artery disease. He status post anterior wall myocardial infarction in 1996. We performed PTCA of his left anterior descending artery.  A stress Myoview study performed in 2000 it reveals anteroapical scar but no evidence of ischemia. His left ventricular systolic function was normal at 56%. An echocardiogram performed in May of 2001 reveals an ejection fraction between 55 and 60%. He has hypokinesis and calcification of the apex at the site of his old apical myocardial infarction.  He has gained some weight.  No chest pain.   Oct. 3, 2013 - He has been busy - he bought a house at [facility] and has been remodeling the house.  He has not been getting as much exercise as he would like.  Oct. 22, 2014    Clinton Garza is doing well.  Still working on his [employer].    He still eats some salt.   He denies any angina.   He was placed on atorvastatin 80 mg a day. Unfortunately, he broke out with a drug rash several days after taking medication. He discontinued the Lipitor at  that time.    April 24, 2014: Clinton Garza is doing well.  No CP.  Still eating some salt.  Does have some DOe if he walks too fast.    Jan. 21, 2016:    Clinton Garza is doing well.  Has gained some weight - spine issues.  Still eats a little extra salt - Does not check his BP at home.   Jan. 23, 2017:  Clinton Garza is seen today for follow up of his CAD, HTN and hyperlipidemia. He has lost about 20 lbs over the past several months.  Has had some vertigo symptoms.    Went to  the ER , found to have hypertensive,    Was restarted back on HCTZ and potassium .  BP is well controlled this am No CP or dyspnea.   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   No CP or dyspnea.    Had back injections this past Monday - has helped his back pain .  Feb. 14, 2018:  No CP or dyspnea.  Having back pain . Has gained some weight .  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 He's done well. He's not had any episodes of chest pain or shortness of breath. Has been gaining some weight.   He's not able to walk as much as he would like because of some back issues.  January 24, 2018:   Clinton Garza is seen today for follow up of his CAD, HTN and hyperlipidemia  Needs to exercise more,  Has back pain  [place] [facility] )   July 20, 2018:  Clinton Garza is seen today for follow-up of his coronary artery disease and hyperlipidemia.  He has had some recent  knee surgery and is walks with a cane. Has knee swelling  Has wet macular degeneration. Doing well from a cardiac standpoint .  August 25, 2019: Clinton Garza is seen today for follow-up of his coronary artery disease and hyperlipidemia. Last Weight  Most recent update: 08/25/2019  8:18 AM    Weight  93.6 kg (206 lb 4 oz)            Is short of breath.   Has not been exercising at all due to lack of exercise (back surgery and knee surgery have limited his walking )  No CP.   We discussed at length  Nov. 4, 2021;  Clinton Garza is seen today for follow up of his CAD and HLD. Had back surgery .   Still having lots of pain .  Due to have another back injection .    August 26, 2021: Clinton Garza is seen for follow-up of his coronary artery disease, hyperlipidemia. Still having leg and back pain .  Had a squamous cell cancer removed  No CP  Has fully retired.   Nov. 6, 2023 Clinton Garza is seen for follow up  of his CAD, HLD  No cardiac issues ,   Had back surgery in Januray.  Still recovering  Hx of HLD . Labs from last year look ok    Is not eating as much , has lost weight and feels better from that standpoint .     Oct. 21, 204  Clinton Garza is seen for follow up for an episode that occurred during the weekend.  He was up for a wedding several days ago.  He had something to eat and then started having dizziness and diaphoresis.  Nausea He thought that it might have been something that he that he had eaten.  He was taken to [facility]. Troponin levels were normal.  Echocardiogram performed on August 14, 2023 reveals normal left ventricular systolic function.  EF is 55 to 60%.  Mild aortic insufficiency. Mild mitral regurgitation Mild pulmonic insufficiency no pericardial effusion.  He is on ASA 81 mg  Feeling well  Is wearing a 2 day monitor    Past Medical History:  Diagnosis Date   Arthritis    Coronary artery disease    Difficult intubation    Dyslipidemia    GERD (gastroesophageal reflux disease)    no meds, diet controlled   History of kidney stones    passed surgery   Hypertension    MI, old    anterior wall   Mixed hyperlipidemia 07/23/2017    Past Surgical History:  Procedure Laterality Date   BACK SURGERY     CARDIAC CATHETERIZATION  05/11/1995   normal left ventricular systolic function with ef of 70-75%   COLONOSCOPY     EYE SURGERY Bilateral    cataracts removed   NECK SURGERY       Current Outpatient Medications  Medication Sig Dispense Refill   acetaminophen (TYLENOL) 500 MG tablet Take 1,000 mg by mouth in the morning and at bedtime.     aspirin 81 MG tablet Take 81 mg by mouth daily.       hydrochlorothiazide (HYDRODIURIL) 25 MG tablet Take 1 tablet (25 mg total) by mouth daily. 90 tablet 3   Iodoquinol-HC (HYDROCORTISONE-IODOQUINOL) 1-1 % CREA Place 1 application onto the skin 2 (two) times daily as needed for rash.  1   lisinopril (ZESTRIL)  20 MG tablet TAKE 1 TABLET BY MOUTH EVERY DAY. PLEASE KEEP UPCOMING APPOINTMENT FOR FUTURE  REFILLS. THANK YOU. 90 tablet 3   meloxicam (MOBIC) 7.5 MG tablet Take 7.5 mg by mouth at bedtime.     Multiple Vitamins-Minerals (PRESERVISION AREDS 2+MULTI VIT PO) Take 1 capsule by mouth in the morning and at bedtime.     nitroGLYCERIN (NITROSTAT) 0.4 MG SL tablet Dissolve 1 tablet under the tongue every 5 minutes as needed for chest pain. Max of 3 doses, then 911. 25 tablet 1   potassium chloride (KLOR-CON M10) 10 MEQ tablet Take 1 tablet (10 mEq total) by mouth daily. 90 tablet 3   rosuvastatin (CRESTOR) 10 MG tablet Take 1 tablet (10 mg total) by mouth daily. 90 tablet 3   methocarbamol (ROBAXIN) 500 MG tablet Take 1 tablet (500 mg total) by mouth every 6 (six) hours as needed for muscle spasms. (Patient not taking: Reported on 08/31/2022) 30 tablet 2   oxyCODONE-acetaminophen (PERCOCET/ROXICET) 5-325 MG tablet Take by mouth every 4 (four) hours as needed for severe pain. (Patient not taking: Reported on 08/16/2023)     No current facility-administered medications for this visit.    Allergies:   Lipitor [atorvastatin], Antivert [meclizine hcl], and Iodinated contrast media    Social History:  The patient  reports that he has never smoked. He has never used smokeless tobacco. He reports that he does not drink alcohol and does not use drugs.   Family History:  The patient's family history includes Cancer in his mother; Heart Problems in his father.    ROS:  Please see the history of present illness.   Otherwise, review of systems are positive for none.   All other systems are reviewed and negative.    Physical Exam: Blood pressure (!) 110/58, pulse 64, height 5\' 7"  (1.702 m), weight 182 lb 9.6 oz (82.8 kg), SpO2 95%.       GEN:  Well nourished, well developed in no acute distress HEENT: Normal NECK: No JVD; No carotid bruits LYMPHATICS: No lymphadenopathy CARDIAC: RRR , no murmurs, rubs,  gallops RESPIRATORY:  Clear to auscultation without rales, wheezing or rhonchi  ABDOMEN: Soft, non-tender, non-distended MUSCULOSKELETAL:  No edema; No deformity  SKIN: Warm and dry NEUROLOGIC:  Alert and oriented x 3    EKG:      NSR at 61.  , minimal voltage criteria for LVH    Recent Labs: 09/01/2022: ALT 32; BUN 22; Creatinine, Ser 1.30; Potassium 4.4; Sodium 141    Lipid Panel    Component Value Date/Time   CHOL 122 09/01/2022 0842   TRIG 89 09/01/2022 0842   HDL 36 (L) 09/01/2022 0842   CHOLHDL 3.4 09/01/2022 0842   CHOLHDL 3.6 09/15/2016 0738   VLDL 23 09/15/2016 0738   LDLCALC 69 09/01/2022 0842      Wt Readings from Last 3 Encounters:  08/16/23 182 lb 9.6 oz (82.8 kg)  08/31/22 181 lb 9.6 oz (82.4 kg)  11/17/21 195 lb (88.5 kg)      Other studies Reviewed: Additional studies/ records that were reviewed today include: . Review of the above records demonstrates:    ASSESSMENT AND PLAN:  1.   Coronary artery disease:    no angina ,  continue to follow      2. Hypertension:     BP is well controlled.   3. Hyperlipidemia:       Lipids looked good last year Will recheck tomorrow   4.  Weak episode: He had an episode of weakness, diaphoresis.  There was no signs of stroke.  There  was no motor or sensory changes.  Troponins were negative.  His echocardiogram revealed normal left ventricular systolic function.  His wife was wondering whether or not he had a TIA.  I will defer to his primary medical doctor for that evaluation.  At this point he seems to be quite stable from a cardiovascular standpoint.  I have scanned records from the central hospital in [place].  They should be in the media tab.     Will have him follow up in a year.    Current medicines are reviewed at length with the patient today.  The patient does not have concerns regarding medicines.  The following changes have been made:  None   Labs/ tests ordered today include:   No orders  of the defined types were placed in this encounter.     Disposition:      Signed, Kristeen Miss, MD  08/16/2023 3:33 PM    [facility] [address] [place], [place], [place]  [address] Phone: [phone]; Fax: [phone]

## 2023-08-31 ENCOUNTER — Ambulatory Visit: Payer: Medicare Other | Admitting: Cardiovascular Disease

## 2023-09-09 ENCOUNTER — Other Ambulatory Visit: Payer: Self-pay | Admitting: Cardiovascular Disease

## 2023-11-13 ENCOUNTER — Other Ambulatory Visit: Payer: Self-pay | Admitting: Cardiovascular Disease

## 2023-12-25 ENCOUNTER — Other Ambulatory Visit: Payer: Self-pay | Admitting: Cardiovascular Disease

## 2024-01-13 ENCOUNTER — Encounter: Payer: Self-pay | Admitting: Cardiovascular Disease

## 2024-01-13 NOTE — Progress Notes (Unsigned)
 Cardiology Office No   Date:  01/14/2024   ID:  Clinton Garza, DOB 08/31/1936, MRN [id number]  PCP:  Levy Sjogren, NP  Cardiologist:   Kristeen Miss, MD    Chief Complaint  Patient presents with   Coronary Artery Disease        Hypertension           Clinton Garza is a 88 y.o. male who presents for   1. Coronary artery disease-status post anterior wall myocardial infarction in 1996. He status post PTCA of the LAD 2. Hyperlipidemia   Clinton Garza is a 88 year old gentleman with a history of coronary artery disease. He status post anterior wall myocardial infarction in 1996. We performed PTCA of his left anterior descending artery.  A stress Myoview study performed in 2000 it reveals anteroapical scar but no evidence of ischemia. His left ventricular systolic function was normal at 56%. An echocardiogram performed in May of 2001 reveals an ejection fraction between 55 and 60%. He has hypokinesis and calcification of the apex at the site of his old apical myocardial infarction.  He has gained some weight.  No chest pain.   Oct. 3, 2013 - He has been busy - he bought a house at [facility] and has been remodeling the house.  He has not been getting as much exercise as he would like.  Oct. 22, 2014    Clinton Garza is doing well.  Still working on his [employer]    He still eats some salt.   He denies any angina.   He was placed on atorvastatin 80 mg a day. Unfortunately, he broke out with a drug rash several days after taking medication. He discontinued the Lipitor at  that time.    April 24, 2014: Clinton Garza is doing well.  No CP.  Still eating some salt.  Does have some DOe if he walks too fast.    Jan. 21, 2016:    Clinton Garza is doing well.  Has gained some weight - spine issues.  Still eats a little extra salt - Does not check his BP at home.   Jan. 23, 2017:  Clinton Garza is seen today for follow up of his CAD, HTN and hyperlipidemia. He has lost about 20 lbs over the  past several months.  Has had some vertigo symptoms.    Went to the ER , found to have hypertensive,    Was restarted back on HCTZ and potassium .  BP is well controlled this am No CP or dyspnea.   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   No CP or dyspnea.    Had back injections this past Monday - has helped his back pain .  Feb. 14, 2018:  No CP or dyspnea.  Having back pain . Has gained some weight .  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 [place] [facility] [place]    07/23/2017 Clinton Garza is seen today for follow-up visit. He's done well. He's not had any episodes of chest pain or shortness of breath. Has been gaining some weight.   He's not able to walk as much as he would like because of some back issues.  January 24, 2018:   Clinton Garza is seen today for follow up of his CAD, HTN and hyperlipidemia  Needs to exercise more,  Has back pain  [place] [facility] )   July 20, 2018:  Clinton Garza is seen today for follow-up of his coronary artery disease and hyperlipidemia.  He has had some recent knee surgery and is walks with a cane. Has knee swelling  Has wet macular degeneration. Doing well from a cardiac standpoint .  August 25, 2019: Clinton Garza is seen today for follow-up of his coronary artery disease and hyperlipidemia. Last Weight  Most recent update: 08/25/2019  8:18 AM    Weight  93.6 kg (206 lb 4 oz)            Is short of breath.   Has not been exercising at all due to lack of exercise (back surgery and knee surgery have limited his walking )  No CP.   We discussed at length  Nov. 4, 2021;  Clinton Garza is seen today for follow up of his CAD and HLD. Had back surgery .   Still having lots of pain .  Due to have another back injection .    August 26, 2021: Clinton Garza is seen for follow-up of his coronary artery disease, hyperlipidemia. Still having leg and back pain .  Had a squamous cell cancer removed  No CP   Has fully retired.   Nov. 6, 2023 Clinton Garza is seen for follow up of his CAD, HLD  No cardiac issues ,   Had back surgery in Januray.  Still recovering  Hx of HLD . Labs from last year look ok    Is not eating as much , has lost weight and feels better from that standpoint .     Oct. 21, 204  Clinton Garza is seen for follow up for an episode that occurred during the weekend.  He was up for a wedding several days ago.  He had something to eat and then started having dizziness and diaphoresis.  Nausea He thought that it might have been something that he that he had eaten.  He was taken to [facility]. Troponin levels were normal.  Echocardiogram performed on August 14, 2023 reveals normal left ventricular systolic function.  EF is 55 to 60%.  Mild aortic insufficiency. Mild mitral regurgitation Mild pulmonic insufficiency no pericardial effusion.  He is on ASA 81 mg  Feeling well  Is wearing a 2 day monitor    January 14, 2024 Clinton Garza is seen for follow up of his CAD, HTN, HLD   Is now using a walker  Is falling frequently  I'm concerned about Parkinsons or a similar neuromuscular issue      Past Medical History:  Diagnosis Date   Arthritis    Coronary artery disease    Difficult intubation    Dyslipidemia    GERD (gastroesophageal reflux disease)    no meds, diet controlled   History of kidney stones    passed surgery   Hypertension    MI, old    anterior wall   Mixed hyperlipidemia 07/23/2017   Nausea & vomiting 08/16/2023    Past Surgical History:  Procedure Laterality Date   BACK SURGERY     CARDIAC CATHETERIZATION  05/11/1995   normal left ventricular systolic function with ef of 70-75%   COLONOSCOPY     EYE SURGERY Bilateral    cataracts removed   NECK SURGERY       Current Outpatient Medications  Medication Sig Dispense Refill   acetaminophen (TYLENOL) 500 MG tablet Take 1,000 mg by mouth in the morning and at bedtime.     aspirin 81 MG tablet Take  81 mg by mouth daily.  hydrochlorothiazide (HYDRODIURIL) 25 MG tablet TAKE 1 TABLET (25 MG TOTAL) BY MOUTH DAILY. 90 tablet 2   lisinopril (ZESTRIL) 20 MG tablet TAKE 1 TABLET BY MOUTH EVERY DAY. PLEASE KEEP UPCOMING APPOINTMENT FOR FUTURE REFILLS. THANK YOU. 90 tablet 1   Multiple Vitamins-Minerals (PRESERVISION AREDS 2+MULTI VIT PO) Take 1 capsule by mouth in the morning and at bedtime.     nitroGLYCERIN (NITROSTAT) 0.4 MG SL tablet Dissolve 1 tablet under the tongue every 5 minutes as needed for chest pain. Max of 3 doses, then 911. 25 tablet 1   potassium chloride (KLOR-CON M10) 10 MEQ tablet TAKE 1 TABLET BY MOUTH EVERY DAY 90 tablet 2   rosuvastatin (CRESTOR) 10 MG tablet TAKE 1 TABLET BY MOUTH EVERY DAY 90 tablet 2   Iodoquinol-HC (HYDROCORTISONE-IODOQUINOL) 1-1 % CREA Place 1 application onto the skin 2 (two) times daily as needed for rash. (Patient not taking: Reported on 01/14/2024)  1   meloxicam (MOBIC) 7.5 MG tablet Take 7.5 mg by mouth at bedtime. (Patient not taking: Reported on 01/14/2024)     methocarbamol (ROBAXIN) 500 MG tablet Take 1 tablet (500 mg total) by mouth every 6 (six) hours as needed for muscle spasms. (Patient not taking: Reported on 01/14/2024) 30 tablet 2   oxyCODONE-acetaminophen (PERCOCET/ROXICET) 5-325 MG tablet Take by mouth every 4 (four) hours as needed for severe pain. (Patient not taking: Reported on 01/14/2024)     No current facility-administered medications for this visit.    Allergies:   Lipitor [atorvastatin], Antivert [meclizine hcl], and Iodinated contrast media    Social History:  The patient  reports that he has never smoked. He has never used smokeless tobacco. He reports that he does not drink alcohol and does not use drugs.   Family History:  The patient's family history includes Cancer in his mother; Heart Problems in his father.    ROS:  Please see the history of present illness.   Otherwise, review of systems are positive for none.   All  other systems are reviewed and negative.     Physical Exam: Blood pressure 138/60, pulse 65, height 5\' 6"  (1.676 m), weight 188 lb (85.3 kg), SpO2 96%.       GEN:  Well nourished, well developed in no acute distress HEENT: Normal NECK: No JVD; No carotid bruits LYMPHATICS: No lymphadenopathy CARDIAC: RRR , no murmurs, rubs, gallops RESPIRATORY:  Clear to auscultation without rales, wheezing or rhonchi  ABDOMEN: Soft, non-tender, non-distended MUSCULOSKELETAL:  No edema; No deformity  SKIN: Warm and dry NEUROLOGIC:   moving slower now.  Concerning for Parkinsons or similar issue      EKG:        EKG Interpretation Date/Time:  Friday January 14 2024 09:26:27 EDT Ventricular Rate:  65 PR Interval:  192 QRS Duration:  78 QT Interval:  410 QTC Calculation: 426 R Axis:   -50  Text Interpretation: Sinus rhythm with occasional Premature ventricular complexes Left axis deviation When compared with ECG of 11-Oct-2015 18:38, Premature ventricular complexes are now Present Confirmed by Kristeen Miss ([id number]) on 01/14/2024 9:38:08 AM      Recent Labs: No results found for requested labs within last 365 days.    Lipid Panel    Component Value Date/Time   CHOL 122 09/01/2022 0842   TRIG 89 09/01/2022 0842   HDL 36 (L) 09/01/2022 0842   CHOLHDL 3.4 09/01/2022 0842   CHOLHDL 3.6 09/15/2016 0738   VLDL 23 09/15/2016 0738   LDLCALC 69 09/01/2022  0102      Wt Readings from Last 3 Encounters:  01/14/24 188 lb (85.3 kg)  08/16/23 182 lb 9.6 oz (82.8 kg)  08/31/22 181 lb 9.6 oz (82.4 kg)      Other studies Reviewed: Additional studies/ records that were reviewed today include: . Review of the above records demonstrates:    ASSESSMENT AND PLAN:  1.   Coronary artery disease:      no angina .   Cont current meds   2. Hypertension:      BP is well controlled.   3. Hyperlipidemia:      lipids look great from Nov. 2023  Recheck today     4.  Neuromuscular weakness :  I'm  concerned about parkinsons or similar          Will have him follow up in a year.    Current medicines are reviewed at length with the patient today.  The patient does not have concerns regarding medicines.  The following changes have been made:  None   Labs/ tests ordered today include:   Orders Placed This Encounter  Procedures   EKG 12-Lead      Disposition:      Signed, Kristeen Miss, MD  01/14/2024 9:44 AM    [facility] [address] [place], [place], [place]  [address] Phone: [phone]; Fax: [phone]

## 2024-01-14 ENCOUNTER — Ambulatory Visit: Payer: Medicare Other | Attending: Cardiovascular Disease | Admitting: Cardiovascular Disease

## 2024-01-14 VITALS — BP 138/60 | HR 65 | Ht 66.0 in | Wt 188.0 lb

## 2024-01-14 DIAGNOSIS — I2510 Atherosclerotic heart disease of native coronary artery without angina pectoris: Secondary | ICD-10-CM | POA: Diagnosis not present

## 2024-01-14 DIAGNOSIS — E782 Mixed hyperlipidemia: Secondary | ICD-10-CM

## 2024-01-14 DIAGNOSIS — I10 Essential (primary) hypertension: Secondary | ICD-10-CM

## 2024-01-14 NOTE — Patient Instructions (Signed)
 Lab Work: Lipids, ALT, BMET today If you have labs (blood work) drawn today and your tests are completely normal, you will receive your results only by: MyChart Message (if you have MyChart) OR A paper copy in the mail If you have any lab test that is abnormal or we need to change your treatment, we will call you to review the results.  Follow-Up: At [facility], you and your health needs are our priority.  As part of our continuing mission to provide you with exceptional heart care, we have created designated Provider Care Teams.  These Care Teams include your primary Cardiologist (physician) and Advanced Practice Providers (APPs -  Physician Assistants and Nurse Practitioners) who all work together to provide you with the care you need, when you need it.  Your next appointment:   1 year(s)  Provider:   Alverda Skeans, MD        1st Floor: - Lobby - Registration  - Pharmacy  - Lab - Cafe  2nd Floor: - PV Lab - Diagnostic Testing (echo, CT, nuclear med)  3rd Floor: - Vacant  4th Floor: - TCTS (cardiothoracic surgery) - AFib Clinic - Structural Heart Clinic - Vascular Surgery  - Vascular Ultrasound  5th Floor: - HeartCare Cardiology (general and EP) - Clinical Pharmacy for coumadin, hypertension, lipid, weight-loss medications, and med management appointments    Valet parking services will be available as well.

## 2024-02-29 ENCOUNTER — Encounter: Payer: Self-pay | Admitting: Cardiovascular Disease

## 2024-03-01 ENCOUNTER — Encounter: Payer: Self-pay | Admitting: Cardiovascular Disease

## 2024-03-01 NOTE — Telephone Encounter (Signed)
 Please see documentation in 5/6 Mychart message

## 2024-03-01 NOTE — Telephone Encounter (Signed)
 Patient identification verified by 2 forms. Hilton Lucky, RN    Called and spoke to patients wife Jamison Mccreedy states:   -swelling and fall are not related   -swelling have improved since wearing compression stocking and elevating legs   -would like to know if patient should switch to lasix   -this is the 8th time patients legs have swelled up this much   -will bring patient to lab tomorrow morning for 3/21 labs Deatra Face denies:   -SOB/difficulty breathing  Informed Pat message sent to Dr. Alroy Aspen for input

## 2024-03-01 NOTE — Telephone Encounter (Signed)
 Attempted to call patient, no answer left message requesting a call back.

## 2024-03-02 LAB — ALT: ALT: 23 IU/L (ref 0–44)

## 2024-03-02 LAB — LIPID PANEL
Chol/HDL Ratio: 3 ratio (ref 0.0–5.0)
Cholesterol, Total: 130 mg/dL (ref 100–199)
HDL: 43 mg/dL (ref >39)
LDL Chol Calc (NIH): 69 mg/dL (ref 0–99)
Triglycerides: 94 mg/dL (ref 0–149)
VLDL Cholesterol Cal: 18 mg/dL (ref 5–40)

## 2024-03-02 LAB — BASIC METABOLIC PANEL WITH GFR
BUN/Creatinine Ratio: 16 (ref 10–24)
BUN: 21 mg/dL (ref 8–27)
CO2: 24 mmol/L (ref 20–29)
Calcium: 9.3 mg/dL (ref 8.6–10.2)
Chloride: 104 mmol/L (ref 96–106)
Creatinine, Ser: 1.28 mg/dL — ABNORMAL HIGH (ref 0.76–1.27)
Glucose: 103 mg/dL — ABNORMAL HIGH (ref 70–99)
Potassium: 4.5 mmol/L (ref 3.5–5.2)
Sodium: 141 mmol/L (ref 134–144)
eGFR: 55 mL/min/{1.73_m2} — ABNORMAL LOW (ref >59)

## 2024-03-03 ENCOUNTER — Encounter: Payer: Self-pay | Admitting: Cardiovascular Disease

## 2024-03-07 ENCOUNTER — Ambulatory Visit: Payer: Self-pay

## 2024-03-09 ENCOUNTER — Other Ambulatory Visit: Payer: Self-pay | Admitting: Cardiovascular Disease

## 2024-03-27 ENCOUNTER — Ambulatory Visit: Admitting: Physician Assistant

## 2024-07-15 ENCOUNTER — Encounter: Payer: Self-pay | Admitting: Internal Medicine

## 2024-07-17 MED ORDER — NITROGLYCERIN 0.4 MG SL SUBL: SUBLINGUAL_TABLET | SUBLINGUAL | 3 refills | Status: AC

## 2024-08-21 ENCOUNTER — Other Ambulatory Visit: Payer: Self-pay | Admitting: Internal Medicine

## 2024-08-21 MED ORDER — ROSUVASTATIN CALCIUM 10 MG PO TABS: 10.0000 mg | ORAL_TABLET | Freq: Every day | ORAL | 1 refills | Status: AC

## 2024-09-13 ENCOUNTER — Encounter: Payer: Self-pay | Admitting: Internal Medicine

## 2024-09-13 ENCOUNTER — Telehealth: Payer: Self-pay | Admitting: Internal Medicine

## 2024-09-13 NOTE — Telephone Encounter (Signed)
*  STAT* If patient is at the pharmacy, call can be transferred to refill team.   1. Which medications need to be refilled? (please list name of each medication and dose if known) hydrochlorothiazide  (HYDRODIURIL ) 25 MG tablet    2. Would you like to learn more about the convenience, safety, & potential cost savings by using the [facility]?      3. Are you open to using the [facility] (Type [facility].  ).   4. Which pharmacy/location (including street and city if local pharmacy) is medication to be sent to? [id number] [place], [place] - [address]    5. Do they need a 30 day or 90 day supply? 90 day

## 2024-09-14 MED ORDER — HYDROCHLOROTHIAZIDE 25 MG PO TABS: 25.0000 mg | ORAL_TABLET | Freq: Every day | ORAL | 1 refills | Status: DC

## 2024-09-14 NOTE — Telephone Encounter (Signed)
 Refill sent.

## 2024-09-14 NOTE — Telephone Encounter (Signed)
 Refill pool addressed.

## 2024-09-16 ENCOUNTER — Emergency Department (HOSPITAL_COMMUNITY)

## 2024-09-16 ENCOUNTER — Inpatient Hospital Stay (HOSPITAL_COMMUNITY)
Admission: EM | Admit: 2024-09-16 | Discharge: 2024-09-22 | DRG: 603 | Disposition: A | Discharge status: Skilled Nursing Facility | Attending: Internal Medicine | Admitting: Internal Medicine

## 2024-09-16 ENCOUNTER — Encounter (HOSPITAL_COMMUNITY): Payer: Self-pay

## 2024-09-16 DIAGNOSIS — I252 Old myocardial infarction: Secondary | ICD-10-CM

## 2024-09-16 DIAGNOSIS — W19XXXA Unspecified fall, initial encounter: Secondary | ICD-10-CM | POA: Diagnosis not present

## 2024-09-16 DIAGNOSIS — I160 Hypertensive urgency: Secondary | ICD-10-CM | POA: Diagnosis present

## 2024-09-16 DIAGNOSIS — I130 Hypertensive heart and chronic kidney disease with heart failure and stage 1 through stage 4 chronic kidney disease, or unspecified chronic kidney disease: Secondary | ICD-10-CM | POA: Diagnosis present

## 2024-09-16 DIAGNOSIS — M79602 Pain in left arm: Secondary | ICD-10-CM | POA: Diagnosis present

## 2024-09-16 DIAGNOSIS — L03116 Cellulitis of left lower limb: Secondary | ICD-10-CM | POA: Diagnosis not present

## 2024-09-16 DIAGNOSIS — I10 Essential (primary) hypertension: Secondary | ICD-10-CM | POA: Diagnosis present

## 2024-09-16 DIAGNOSIS — M7989 Other specified soft tissue disorders: Secondary | ICD-10-CM | POA: Diagnosis present

## 2024-09-16 DIAGNOSIS — Z23 Encounter for immunization: Secondary | ICD-10-CM | POA: Diagnosis present

## 2024-09-16 DIAGNOSIS — Z7982 Long term (current) use of aspirin: Secondary | ICD-10-CM

## 2024-09-16 DIAGNOSIS — Z66 Do not resuscitate: Secondary | ICD-10-CM | POA: Diagnosis present

## 2024-09-16 DIAGNOSIS — Z961 Presence of intraocular lens: Secondary | ICD-10-CM | POA: Diagnosis present

## 2024-09-16 DIAGNOSIS — S41112A Laceration without foreign body of left upper arm, initial encounter: Secondary | ICD-10-CM | POA: Diagnosis present

## 2024-09-16 DIAGNOSIS — M79601 Pain in right arm: Secondary | ICD-10-CM | POA: Diagnosis present

## 2024-09-16 DIAGNOSIS — N183 Chronic kidney disease, stage 3 unspecified: Secondary | ICD-10-CM | POA: Diagnosis present

## 2024-09-16 DIAGNOSIS — T148XXA Other injury of unspecified body region, initial encounter: Secondary | ICD-10-CM | POA: Diagnosis not present

## 2024-09-16 DIAGNOSIS — Z9842 Cataract extraction status, left eye: Secondary | ICD-10-CM

## 2024-09-16 DIAGNOSIS — Z9841 Cataract extraction status, right eye: Secondary | ICD-10-CM

## 2024-09-16 DIAGNOSIS — I959 Hypotension, unspecified: Secondary | ICD-10-CM | POA: Diagnosis present

## 2024-09-16 DIAGNOSIS — E785 Hyperlipidemia, unspecified: Secondary | ICD-10-CM | POA: Diagnosis present

## 2024-09-16 DIAGNOSIS — I2510 Atherosclerotic heart disease of native coronary artery without angina pectoris: Secondary | ICD-10-CM | POA: Diagnosis present

## 2024-09-16 DIAGNOSIS — Z87442 Personal history of urinary calculi: Secondary | ICD-10-CM

## 2024-09-16 DIAGNOSIS — N1831 Chronic kidney disease, stage 3a: Secondary | ICD-10-CM | POA: Diagnosis present

## 2024-09-16 DIAGNOSIS — K5730 Diverticulosis of large intestine without perforation or abscess without bleeding: Secondary | ICD-10-CM | POA: Diagnosis present

## 2024-09-16 DIAGNOSIS — Z79899 Other long term (current) drug therapy: Secondary | ICD-10-CM

## 2024-09-16 DIAGNOSIS — E872 Acidosis, unspecified: Secondary | ICD-10-CM | POA: Diagnosis present

## 2024-09-16 DIAGNOSIS — D72829 Elevated white blood cell count, unspecified: Secondary | ICD-10-CM

## 2024-09-16 DIAGNOSIS — L89326 Pressure-induced deep tissue damage of left buttock: Secondary | ICD-10-CM | POA: Diagnosis present

## 2024-09-16 DIAGNOSIS — R7989 Other specified abnormal findings of blood chemistry: Secondary | ICD-10-CM | POA: Diagnosis present

## 2024-09-16 DIAGNOSIS — I444 Left anterior fascicular block: Secondary | ICD-10-CM | POA: Diagnosis present

## 2024-09-16 DIAGNOSIS — E860 Dehydration: Secondary | ICD-10-CM | POA: Diagnosis present

## 2024-09-16 DIAGNOSIS — S0990XA Unspecified injury of head, initial encounter: Secondary | ICD-10-CM | POA: Diagnosis present

## 2024-09-16 DIAGNOSIS — I340 Nonrheumatic mitral (valve) insufficiency: Secondary | ICD-10-CM | POA: Diagnosis present

## 2024-09-16 DIAGNOSIS — L0390 Cellulitis, unspecified: Secondary | ICD-10-CM | POA: Diagnosis present

## 2024-09-16 DIAGNOSIS — S51012A Laceration without foreign body of left elbow, initial encounter: Secondary | ICD-10-CM

## 2024-09-16 DIAGNOSIS — Y92009 Unspecified place in unspecified non-institutional (private) residence as the place of occurrence of the external cause: Secondary | ICD-10-CM

## 2024-09-16 DIAGNOSIS — S51019A Laceration without foreign body of unspecified elbow, initial encounter: Secondary | ICD-10-CM | POA: Diagnosis present

## 2024-09-16 DIAGNOSIS — E782 Mixed hyperlipidemia: Secondary | ICD-10-CM | POA: Diagnosis present

## 2024-09-16 DIAGNOSIS — L89316 Pressure-induced deep tissue damage of right buttock: Secondary | ICD-10-CM | POA: Diagnosis present

## 2024-09-16 DIAGNOSIS — H548 Legal blindness, as defined in USA: Secondary | ICD-10-CM | POA: Diagnosis present

## 2024-09-16 LAB — CBC WITH DIFFERENTIAL/PLATELET
Abs Immature Granulocytes: 0.06 K/uL (ref 0.00–0.07)
Basophils Absolute: 0 K/uL (ref 0.0–0.1)
Basophils Relative: 0 %
Eosinophils Absolute: 0 K/uL (ref 0.0–0.5)
Eosinophils Relative: 0 %
HCT: 43.2 % (ref 39.0–52.0)
Hemoglobin: 14.2 g/dL (ref 13.0–17.0)
Immature Granulocytes: 0 %
Lymphocytes Relative: 7 %
Lymphs Abs: 1 K/uL (ref 0.7–4.0)
MCH: 32.9 pg (ref 26.0–34.0)
MCHC: 32.9 g/dL (ref 30.0–36.0)
MCV: 100 fL (ref 80.0–100.0)
Monocytes Absolute: 0.8 K/uL (ref 0.1–1.0)
Monocytes Relative: 6 %
Neutro Abs: 12.8 K/uL — ABNORMAL HIGH (ref 1.7–7.7)
Neutrophils Relative %: 87 %
Platelets: 120 K/uL — ABNORMAL LOW (ref 150–400)
RBC: 4.32 MIL/uL (ref 4.22–5.81)
RDW: 13.3 % (ref 11.5–15.5)
WBC: 14.7 K/uL — ABNORMAL HIGH (ref 4.0–10.5)
nRBC: 0 % (ref 0.0–0.2)

## 2024-09-16 LAB — COMPREHENSIVE METABOLIC PANEL WITH GFR
ALT: 35 U/L (ref 0–44)
AST: 51 U/L — ABNORMAL HIGH (ref 15–41)
Albumin: 3.7 g/dL (ref 3.5–5.0)
Alkaline Phosphatase: 46 U/L (ref 38–126)
Anion gap: 10 (ref 5–15)
BUN: 36 mg/dL — ABNORMAL HIGH (ref 8–23)
CO2: 22 mmol/L (ref 22–32)
Calcium: 9.4 mg/dL (ref 8.9–10.3)
Chloride: 107 mmol/L (ref 98–111)
Creatinine, Ser: 1.48 mg/dL — ABNORMAL HIGH (ref 0.61–1.24)
GFR, Estimated: 46 mL/min — ABNORMAL LOW (ref >60)
Glucose, Bld: 149 mg/dL — ABNORMAL HIGH (ref 70–99)
Potassium: 4.2 mmol/L (ref 3.5–5.1)
Sodium: 140 mmol/L (ref 135–145)
Total Bilirubin: 1 mg/dL (ref 0.0–1.2)
Total Protein: 6 g/dL — ABNORMAL LOW (ref 6.5–8.1)

## 2024-09-16 LAB — CK: Total CK: 379 U/L (ref 49–397)

## 2024-09-16 LAB — TROPONIN T, HIGH SENSITIVITY
Troponin T High Sensitivity: 60 ng/L — ABNORMAL HIGH (ref 0–19)
Troponin T High Sensitivity: 68 ng/L — ABNORMAL HIGH (ref 0–19)

## 2024-09-16 LAB — PRO BRAIN NATRIURETIC PEPTIDE: Pro Brain Natriuretic Peptide: 232 pg/mL (ref <300.0)

## 2024-09-16 MED ORDER — MORPHINE SULFATE (PF) 2 MG/ML IV SOLN
2.0000 mg | Freq: Once | INTRAVENOUS | Status: AC
  Administered 2024-09-17: 2 mg via INTRAVENOUS
  Filled 2024-09-16: qty 1

## 2024-09-16 MED ORDER — ONDANSETRON HCL 4 MG PO TABS: 4.0000 mg | ORAL_TABLET | Freq: Four times a day (QID) | ORAL | Status: DC | PRN

## 2024-09-16 MED ORDER — ACETAMINOPHEN 650 MG RE SUPP: 650.0000 mg | Freq: Four times a day (QID) | RECTAL | Status: DC | PRN

## 2024-09-16 MED ORDER — ROSUVASTATIN CALCIUM 10 MG PO TABS
10.0000 mg | ORAL_TABLET | Freq: Every day | ORAL | Status: DC
  Administered 2024-09-17 – 2024-09-22 (×6): 10 mg via ORAL
  Filled 2024-09-16 (×6): qty 1

## 2024-09-16 MED ORDER — HYDROCODONE-ACETAMINOPHEN 5-325 MG PO TABS
1.0000 | ORAL_TABLET | ORAL | Status: DC | PRN
  Administered 2024-09-17: 2 via ORAL
  Administered 2024-09-17: 1 via ORAL
  Administered 2024-09-19 – 2024-09-21 (×3): 2 via ORAL
  Filled 2024-09-16 (×2): qty 2
  Filled 2024-09-16: qty 1
  Filled 2024-09-16 (×2): qty 2

## 2024-09-16 MED ORDER — SODIUM CHLORIDE 0.9 % IV BOLUS
500.0000 mL | Freq: Once | INTRAVENOUS | Status: AC
  Administered 2024-09-16: 500 mL via INTRAVENOUS

## 2024-09-16 MED ORDER — ONDANSETRON HCL 4 MG/2ML IJ SOLN: 4.0000 mg | Freq: Four times a day (QID) | INTRAMUSCULAR | Status: DC | PRN

## 2024-09-16 MED ORDER — CEFAZOLIN SODIUM-DEXTROSE 2-4 GM/100ML-% IV SOLN
2.0000 g | Freq: Three times a day (TID) | INTRAVENOUS | Status: DC
  Administered 2024-09-17 – 2024-09-22 (×16): 2 g via INTRAVENOUS
  Filled 2024-09-16 (×18): qty 100

## 2024-09-16 MED ORDER — CEFAZOLIN SODIUM-DEXTROSE 2-4 GM/100ML-% IV SOLN
2.0000 g | Freq: Once | INTRAVENOUS | Status: AC
  Administered 2024-09-16: 2 g via INTRAVENOUS
  Filled 2024-09-16: qty 100

## 2024-09-16 MED ORDER — METHOCARBAMOL 1000 MG/10ML IJ SOLN: 500.0000 mg | Freq: Four times a day (QID) | INTRAMUSCULAR | Status: DC | PRN

## 2024-09-16 MED ORDER — FENTANYL CITRATE (PF) 50 MCG/ML IJ SOSY: 12.5000 ug | PREFILLED_SYRINGE | INTRAMUSCULAR | Status: DC | PRN

## 2024-09-16 MED ORDER — ACETAMINOPHEN 325 MG PO TABS
650.0000 mg | ORAL_TABLET | Freq: Four times a day (QID) | ORAL | Status: DC | PRN
  Administered 2024-09-17 – 2024-09-19 (×5): 650 mg via ORAL
  Filled 2024-09-16 (×5): qty 2

## 2024-09-16 MED ORDER — ASPIRIN 81 MG PO CHEW
81.0000 mg | CHEWABLE_TABLET | Freq: Every day | ORAL | Status: DC
  Administered 2024-09-17 – 2024-09-18 (×2): 81 mg via ORAL
  Filled 2024-09-16 (×2): qty 1

## 2024-09-16 MED ORDER — SODIUM CHLORIDE 0.9 % IV SOLN: INTRAVENOUS | Status: AC

## 2024-09-16 MED ORDER — TETANUS-DIPHTH-ACELL PERTUSSIS 5-2-15.5 LF-MCG/0.5 IM SUSP
0.5000 mL | Freq: Once | INTRAMUSCULAR | Status: AC
  Administered 2024-09-16: 0.5 mL via INTRAMUSCULAR
  Filled 2024-09-16: qty 0.5

## 2024-09-16 NOTE — Assessment & Plan Note (Signed)
-  admit per? cellulitis protocol will  ?     continue current antibiotic choice ?     plain films showed:  no evidence of air no evidence of osteomyelitis   no               foreign   objects ?      Will obtain MRSA screening,  ?    obtain blood cultures ?if febrile or septic ?    further antibiotic adjustment pending above results ? ?

## 2024-09-16 NOTE — ED Triage Notes (Signed)
 Pt BIB ems for a fall today outside of his house, fell on the right side but denies hitting his head, no loc, not on Blood thinners. Pain mostly in the left leg due to a recent injury. A&Ox4 VS stable

## 2024-09-16 NOTE — Assessment & Plan Note (Signed)
 Will need PT OT evaluation prior to discharge

## 2024-09-16 NOTE — ED Provider Notes (Signed)
 I provided a substantive portion of the care of this patient.  I personally made/approved the management plan for this patient and take responsibility for the patient management.  EKG Interpretation Date/Time:  Saturday September 16 2024 17:20:24 EST Ventricular Rate:  85 PR Interval:    QRS Duration:  112 QT Interval:  363 QTC Calculation: 432 R Axis:   -59  Text Interpretation: Normal sinus rhythm Left anterior fascicular block Abnormal R-wave progression, early transition Consider anterior infarct ST elevation suggests acute pericarditis Otherwise no significant change Confirmed by Weronika Birch [phone]) on 09/16/2024 5:29:39 PM   Seen by me along with the physician assistant.  Patient had a fall outside of his house.  Normally walks with a walker it was mechanical fall.  Went down denies hitting his head but has significant abrasions to knuckles on both hands and significant skin tears to the left arm and left forearm.  Both lower extremities are swollen the left 1 is quite red and quite warm.  Wife says that he does sometimes have redness they are with some sure secondary to the chronic swelling.  Which is much more red today.  Patient's tetanus not up-to-date.  Temp here 97.9 pulse 88 respirations 20 blood pressure 138/58 oxygen saturation is 97%.  Patient's past medical history is known for coronary disease hypertension hyperlipidemia.  Patient is not on blood thinners.  Patient will need CT head neck chest abdomen and pelvis will need CK.  X-ray of left tib-fib which shows soft tissue's are unremarkable and nothing osseous acute abnormality.  I think the patient definitely has a cellulitis significant cellulitis left lower extremity.  Will probably need admission for that.  Patient's labs are pending.  Patient denies any chest pain.   Letisia Schwalb, MD 09/16/24 1806

## 2024-09-16 NOTE — Assessment & Plan Note (Signed)
 No associated chest pain or EKG changes.  Will continue to cycle cardiac enzymes likely in the setting of dehydration known history of CAD.  And CKD.  Continue to monitor obtain serial EKG obtain echo in the morning

## 2024-09-16 NOTE — Assessment & Plan Note (Addendum)
 Continue Crestor  10 g a day

## 2024-09-16 NOTE — Assessment & Plan Note (Signed)
 Most likely in the setting of cellulitis but will obtain Dopplers given significance and duration

## 2024-09-16 NOTE — Assessment & Plan Note (Signed)
Given soft blood pressures allow permissive hypertension

## 2024-09-16 NOTE — Assessment & Plan Note (Signed)
 -  Wound care consult

## 2024-09-16 NOTE — ED Provider Notes (Signed)
 [place] EMERGENCY DEPARTMENT AT [facility] Provider Note   CSN: [id number] Arrival date & time: 09/16/24  1615     Patient presents with: Clinton Garza is a 88 y.o. male.   Fall  Patient is a an 88 year old male presenting ED today for concerns for injuries post mechanical fall happening earlier today.  Reports that he had fallen when his walker got away from him and could not stop it from going.  Notably fell down onto left side, where he reported he did not hit his head, did not lose consciousness and is not on blood thinners.  Additionally noted that he was on the ground and could not get up for approximately an hour despite hollering for help. With already having noted that he had some left shin pain from a injury from dropping a hammer and hitting his left knee earlier this week which has gotten progressively more painful.  Reporting no pain to ankles, feet, wrists, elbows, shoulders bilaterally.  However slight limited due to distracting pain of left lower leg.  Endorses some bilateral leg swelling that he noticed today.  Denies fever, headache, vision changes, chest pain, shortness of breath, abdominal pain, nausea, vomiting, diarrhea, dysuria, numbness, weakness, tingling.    Prior to Admission medications   Medication Sig Start Date End Date Taking? Authorizing Provider  acetaminophen  (TYLENOL ) 500 MG tablet Take 1,000 mg by mouth in the morning and at bedtime.    [provider]  aspirin  81 MG tablet Take 81 mg by mouth daily.      [provider]  hydrochlorothiazide  (HYDRODIURIL ) 25 MG tablet Take 1 tablet (25 mg total) by mouth daily. 09/14/24   Thukkani, Arun K, MD  Iodoquinol-HC (HYDROCORTISONE -IODOQUINOL) 1-1 % CREA Place 1 application onto the skin 2 (two) times daily as needed for rash. Patient not taking: Reported on 01/14/2024 11/10/17   [provider]  lisinopril  (ZESTRIL ) 20 MG tablet TAKE 1 TABLET BY MOUTH  EVERY DAY. PLEASE KEEP UPCOMING APPOINTMENT FOR FUTURE REFILLS. THANK YOU. 03/09/24   Nahser, Aleene PARAS, MD  meloxicam (MOBIC) 7.5 MG tablet Take 7.5 mg by mouth at bedtime. Patient not taking: Reported on 01/14/2024 07/14/20   [provider]  methocarbamol  (ROBAXIN ) 500 MG tablet Take 1 tablet (500 mg total) by mouth every 6 (six) hours as needed for muscle spasms. Patient not taking: Reported on 01/14/2024 11/19/21   Colon Shove, MD  Multiple Vitamins-Minerals (PRESERVISION AREDS 2+MULTI VIT PO) Take 1 capsule by mouth in the morning and at bedtime.    [provider]  nitroGLYCERIN  (NITROSTAT ) 0.4 MG SL tablet Dissolve 1 tablet under the tongue every 5 minutes as needed for chest pain. Max of 3 doses, then 911. 07/17/24   Thukkani, Arun K, MD  oxyCODONE -acetaminophen  (PERCOCET/ROXICET) 5-325 MG tablet Take by mouth every 4 (four) hours as needed for severe pain. Patient not taking: Reported on 01/14/2024    [provider]  potassium chloride  (KLOR-CON  M10) 10 MEQ tablet TAKE 1 TABLET BY MOUTH EVERY DAY 12/27/23   Nahser, Aleene PARAS, MD  rosuvastatin  (CRESTOR ) 10 MG tablet Take 1 tablet (10 mg total) by mouth daily. 08/21/24   Thukkani, Arun K, MD    Allergies: Lipitor [atorvastatin ], Antivert  [meclizine  hcl], and Iodinated contrast media    Review of Systems  Musculoskeletal:  Positive for arthralgias and myalgias.  Skin:  Positive for wound.  All other systems reviewed and are negative.   Updated Vital Signs BP (!) 138/58 (  BP Location: Right Arm)   Pulse 88   Temp 97.9 F (36.6 C) (Oral)   Resp 20   Ht 5' 6 (1.676 m)   Wt 83.9 kg   SpO2 97%   BMI 29.86 kg/m   Physical Exam Vitals and nursing note reviewed.  Constitutional:      General: He is not in acute distress.    Appearance: Normal appearance. He is not ill-appearing or diaphoretic.     Comments: Visibly shivering.  HENT:     Head: Normocephalic and atraumatic.  Eyes:     General: No scleral  icterus.       Right eye: No discharge.        Left eye: No discharge.     Extraocular Movements: Extraocular movements intact.     Conjunctiva/sclera: Conjunctivae normal.  Neck:     Comments: No midline tenderness to cervical, thoracic, lumbar spine. Cardiovascular:     Rate and Rhythm: Normal rate and regular rhythm.     Pulses: Normal pulses.     Heart sounds: Normal heart sounds. No murmur heard.    No friction rub. No gallop.  Pulmonary:     Effort: Pulmonary effort is normal. No respiratory distress.     Breath sounds: No stridor. No wheezing, rhonchi or rales.  Chest:     Chest wall: Tenderness present.  Abdominal:     General: Abdomen is flat. There is no distension.     Palpations: Abdomen is soft.     Tenderness: There is no abdominal tenderness. There is no right CVA tenderness, left CVA tenderness, guarding or rebound.  Musculoskeletal:        General: Tenderness and signs of injury present. No swelling or deformity.     Cervical back: Normal range of motion. No rigidity or tenderness.     Right lower leg: No edema.     Left lower leg: No edema.     Comments: Skin tears noted to bilateral upper extremities, pictures noted in chart.  Good ROM at wrist, elbows, shoulders, knees, ankles.  Good DP and radial pulses, 2+.  Skin:    General: Skin is warm and dry.     Findings: Bruising (Notably has some bruising along chest wall with some mild chest wall tenderness.) and erythema present. No lesion.     Comments: Notably does have some painful, cellulitic appearing changes to left anterior shin near previous wound site noted to have been earlier this week.  Neurological:     General: No focal deficit present.     Mental Status: He is alert and oriented to person, place, and time. Mental status is at baseline.     Sensory: No sensory deficit.     Motor: No weakness.  Psychiatric:        Mood and Affect: Mood normal.     (all labs ordered are listed, but only abnormal  results are displayed) Labs Reviewed - No data to display  EKG: None  Radiology: No results found.  Procedures   Medications Ordered in the ED - No data to display   {Click here for ABCD2, HEART and other calculators REFRESH Note before signing:1}                              Medical Decision Making This patient is a ***  who presents to the ED for concern of ***.   Case was discussed with attending who consulted  on this patient.  Differential diagnoses prior to evaluation: The emergent differential diagnosis includes, but is not limited to, fracture, ligamentous injury, neurovascular injury, dislocation, malalignment, intracranial bleed. This is not an exhaustive differential.   Past Medical History / Co-morbidities / Social History: CAD, HTN, HLD, nephrolithiasis, GERD  Additional history: Chart reviewed. Pertinent results include:   Last seen by PCP on 01/25/2024  Lab Tests/Imaging studies: I personally interpreted labs/imaging and the pertinent results include:  ***.   ***I agree with the radiologist interpretation.  Cardiac monitoring: EKG obtained and interpreted by myself and attending physician which shows: ***   Medications: I ordered medication including ***.  I have reviewed the patients home medicines and have made adjustments as needed.  Critical Interventions:  Social Determinants of Health:  Disposition: After consideration of the diagnostic results and the patients response to treatment, I feel that the patient would benefit from ***.   ***emergency department workup does not suggest an emergent condition requiring admission or immediate intervention beyond what has been performed at this time. The plan is: ***. The patient is safe for discharge and has been instructed to return immediately for worsening symptoms, change in symptoms or any other concerns.   Final diagnoses:  None    ED Discharge Orders     None

## 2024-09-16 NOTE — Assessment & Plan Note (Signed)
 Chronic stable continue aspirin  and statin continue Crestor 

## 2024-09-16 NOTE — Assessment & Plan Note (Signed)
-  chronic avoid nephrotoxic medications such as NSAIDs, Vanco Zosyn combo,  avoid hypotension, continue to follow renal function

## 2024-09-16 NOTE — ED Notes (Signed)
 Wound care and dressing done on both right and left open wounds

## 2024-09-16 NOTE — H&P (Signed)
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 medications,     Level of care  progressive    Dyan Creelman 09/16/2024, 11:20 PM    [facility]     after 2 AM please page floor coverage   If 7AM-7PM, please contact the day team taking care of the patient using [email or web address]

## 2024-09-16 NOTE — Subjective & Objective (Signed)
 Patient comes in by EMS due to a fall today fell outside his house on the right side hit his head denies LOC not on AC At baseline walks with a walker Has skin tears of the left arm and left forearm He may have some chronic swelling of his left leg In the ER afebrile Plain images showed left hip negative for air no acute abnormalities  Trauma scans negative for fractures or acute changes

## 2024-09-17 ENCOUNTER — Inpatient Hospital Stay (HOSPITAL_COMMUNITY)

## 2024-09-17 ENCOUNTER — Other Ambulatory Visit: Payer: Self-pay

## 2024-09-17 DIAGNOSIS — I2510 Atherosclerotic heart disease of native coronary artery without angina pectoris: Secondary | ICD-10-CM

## 2024-09-17 DIAGNOSIS — L03116 Cellulitis of left lower limb: Secondary | ICD-10-CM | POA: Diagnosis not present

## 2024-09-17 DIAGNOSIS — R7989 Other specified abnormal findings of blood chemistry: Secondary | ICD-10-CM

## 2024-09-17 DIAGNOSIS — R609 Edema, unspecified: Secondary | ICD-10-CM | POA: Diagnosis not present

## 2024-09-17 LAB — ECHOCARDIOGRAM COMPLETE
Area-P 1/2: 2.61 cm2
Height: 66 in
S' Lateral: 3.3 cm
Weight: 3164.04 [oz_av]

## 2024-09-17 LAB — URINALYSIS, COMPLETE (UACMP) WITH MICROSCOPIC
Bacteria, UA: NONE SEEN
Bilirubin Urine: NEGATIVE
Glucose, UA: NEGATIVE mg/dL
Hgb urine dipstick: NEGATIVE
Ketones, ur: NEGATIVE mg/dL
Leukocytes,Ua: NEGATIVE
Nitrite: NEGATIVE
Protein, ur: NEGATIVE mg/dL
Specific Gravity, Urine: 1.025 (ref 1.005–1.030)
pH: 5 (ref 5.0–8.0)

## 2024-09-17 LAB — COMPREHENSIVE METABOLIC PANEL WITH GFR
ALT: 48 U/L — ABNORMAL HIGH (ref 0–44)
AST: 66 U/L — ABNORMAL HIGH (ref 15–41)
Albumin: 3.4 g/dL — ABNORMAL LOW (ref 3.5–5.0)
Alkaline Phosphatase: 43 U/L (ref 38–126)
Anion gap: 12 (ref 5–15)
BUN: 40 mg/dL — ABNORMAL HIGH (ref 8–23)
CO2: 21 mmol/L — ABNORMAL LOW (ref 22–32)
Calcium: 9 mg/dL (ref 8.9–10.3)
Chloride: 106 mmol/L (ref 98–111)
Creatinine, Ser: 1.61 mg/dL — ABNORMAL HIGH (ref 0.61–1.24)
GFR, Estimated: 41 mL/min — ABNORMAL LOW (ref >60)
Glucose, Bld: 130 mg/dL — ABNORMAL HIGH (ref 70–99)
Potassium: 5.1 mmol/L (ref 3.5–5.1)
Sodium: 139 mmol/L (ref 135–145)
Total Bilirubin: 1.2 mg/dL (ref 0.0–1.2)
Total Protein: 5.6 g/dL — ABNORMAL LOW (ref 6.5–8.1)

## 2024-09-17 LAB — LACTIC ACID, PLASMA
Lactic Acid, Venous: 3 mmol/L (ref 0.5–1.9)
Lactic Acid, Venous: 2.6 mmol/L (ref 0.5–1.9)
Lactic Acid, Venous: 2.3 mmol/L (ref 0.5–1.9)

## 2024-09-17 LAB — CBC
HCT: 39.8 % (ref 39.0–52.0)
Hemoglobin: 12.9 g/dL — ABNORMAL LOW (ref 13.0–17.0)
MCH: 32.8 pg (ref 26.0–34.0)
MCHC: 32.4 g/dL (ref 30.0–36.0)
MCV: 101.3 fL — ABNORMAL HIGH (ref 80.0–100.0)
Platelets: 115 K/uL — ABNORMAL LOW (ref 150–400)
RBC: 3.93 MIL/uL — ABNORMAL LOW (ref 4.22–5.81)
RDW: 13.6 % (ref 11.5–15.5)
WBC: 19.9 K/uL — ABNORMAL HIGH (ref 4.0–10.5)
nRBC: 0 % (ref 0.0–0.2)

## 2024-09-17 LAB — TSH: TSH: 1.68 u[IU]/mL (ref 0.350–4.500)

## 2024-09-17 LAB — PROTIME-INR
INR: 1.1 (ref 0.8–1.2)
Prothrombin Time: 14.6 s (ref 11.4–15.2)

## 2024-09-17 LAB — PHOSPHORUS: Phosphorus: 3.5 mg/dL (ref 2.5–4.6)

## 2024-09-17 LAB — MRSA NEXT GEN BY PCR, NASAL: MRSA by PCR Next Gen: NOT DETECTED

## 2024-09-17 LAB — TROPONIN T, HIGH SENSITIVITY
Troponin T High Sensitivity: 69 ng/L — ABNORMAL HIGH (ref 0–19)
Troponin T High Sensitivity: 64 ng/L — ABNORMAL HIGH (ref 0–19)

## 2024-09-17 LAB — PREALBUMIN: Prealbumin: 26 mg/dL (ref 18–38)

## 2024-09-17 LAB — MAGNESIUM: Magnesium: 2.1 mg/dL (ref 1.7–2.4)

## 2024-09-17 MED ORDER — PERFLUTREN LIPID MICROSPHERE
1.0000 mL | INTRAVENOUS | Status: AC | PRN
  Administered 2024-09-17: 2 mL via INTRAVENOUS

## 2024-09-17 MED ORDER — LACTATED RINGERS IV BOLUS
500.0000 mL | Freq: Once | INTRAVENOUS | Status: AC
  Administered 2024-09-17: 500 mL via INTRAVENOUS

## 2024-09-17 MED ORDER — GERHARDT'S BUTT CREAM
TOPICAL_CREAM | Freq: Two times a day (BID) | CUTANEOUS | Status: DC
  Filled 2024-09-17 (×2): qty 60

## 2024-09-17 NOTE — Consult Note (Signed)
 WOC Nurse Consult Note: patient with fall at home sustaining multiple skin tears  Reason for Consult: 1.  Intertriginous dermatitis pannus/abdominal folds and groin/inner thighs  Erythema with scattered partial thickness skin loss  ICD-10 CM Codes for Irritant Dermatitis   L30.4  - Erythema intertrigo. Also used for abrasion of the hand, chafing of the skin, dermatitis due to sweating and friction, friction dermatitis, friction eczema, and genital/thigh intertrigo.  2.  Full thickness L posterior arm, L forearm, R hand, R forearm/elbow area r/t trauma red moist; some dry hemorrhagic tissue noted  3.  L anterior lower leg full thickness r/t trauma per notes (patient appears to have venous insufficiency with chronic skin changes present) dark purple, some brown  Wound type: as above  Pressure Injury POA: not pressure  Measurement: see nursing flowsheet  Wound bed: as above  Drainage (amount, consistency, odor) see nursing flowsheet  Periwound: ecchymosis noted to arms/hands; dark discoloration noted to B lower legs, edema  Dressing procedure/placement/frequency:  Cleanse skin tears to B arms and hands with Vashe, do not rinse. Apply Xeroform gauze (Lawson [phone]) to wound beds daily and secure with silicone foam or Telfa nonstick dressing and Kerlix roll gauze depending on size and location.  SOAK DRESSINGS WITH NORMAL SALINE IF ADHERED TO WOUND BED FOR ATRAUMATIC REMOVAL.  Cleanse underneath pannus with soap and water, dry apply floor stock antifungal powder (white and green Microguard) then apply Interdry AG as follows: Order Gerlean # [id number] Measure and cut length of InterDry to fit in skin folds that have skin breakdown Tuck InterDry fabric into skin folds in a single layer, allow for 2 inches of overhang from skin edges to allow for wicking to occur May remove to bathe; dry area thoroughly and then tuck into affected areas again Do not apply any creams or ointments when using InterDry DO NOT  THROW AWAY FOR 5 DAYS unless soiled with stool DO NOT [facility] product, this will inactivate the silver in the material  New sheet of Interdry should be applied after 5 days of use if patient continues to have skin breakdown   Cleanse groin/inner thighs with soap and water, dry and apply Gerhardt's Butt Cream 2 times a day and prn soiling.   Cleanse L lower leg wound with Vashe, do not rinse. Apply Xeroform gauze (Lawson [phone]) to wound beds daily and secure with silicone foam or ABD pad and Kerlix roll gauze anchored around foot whichever is preferred/works best.   POC discussed with bedside nurse WOC team will not follow. Re-consult if further needs arise.   Thank you,    Powell Bar MSN, RN-BC, [employer]

## 2024-09-17 NOTE — Progress Notes (Signed)
 Left lower extremity venous duplex has been completed. Preliminary results can be found in CV Proc through chart review.   09/17/24 12:58 PM Cathlyn Collet RVT

## 2024-09-17 NOTE — ED Provider Notes (Incomplete)
 [place] EMERGENCY DEPARTMENT AT [facility] Provider Note   CSN: [id number] Arrival date & time: 09/16/24  1615     Patient presents with: Clinton Garza is a 88 y.o. male.   Fall  Patient is a an 88 year old male presenting ED today for concerns for injuries post mechanical fall happening earlier today.  Reports that he had fallen when his walker got away from him and could not stop it from going.  Notably fell down onto left side, where he reported he did not hit his head, did not lose consciousness and is not on blood thinners.  Additionally noted that he was on the ground and could not get up for approximately an hour despite hollering for help. With already having noted that he had some left shin pain from a injury from dropping a hammer and hitting his left knee earlier this week which has gotten progressively more painful.  Reporting no pain to ankles, feet, wrists, elbows, shoulders bilaterally.  However slight limited due to distracting pain of left lower leg.  Endorses some bilateral leg swelling that he noticed today.  Denies fever, headache, vision changes, chest pain, shortness of breath, abdominal pain, nausea, vomiting, diarrhea, dysuria, numbness, weakness, tingling.    Prior to Admission medications   Medication Sig Start Date End Date Taking? Authorizing Provider  acetaminophen  (TYLENOL ) 500 MG tablet Take 1,000 mg by mouth in the morning and at bedtime.    [provider]  aspirin  81 MG tablet Take 81 mg by mouth daily.      [provider]  hydrochlorothiazide  (HYDRODIURIL ) 25 MG tablet Take 1 tablet (25 mg total) by mouth daily. 09/14/24   Thukkani, Arun K, MD  Iodoquinol-HC (HYDROCORTISONE -IODOQUINOL) 1-1 % CREA Place 1 application onto the skin 2 (two) times daily as needed for rash. Patient not taking: Reported on 01/14/2024 11/10/17   [provider]  lisinopril  (ZESTRIL ) 20 MG tablet TAKE 1 TABLET BY MOUTH  EVERY DAY. PLEASE KEEP UPCOMING APPOINTMENT FOR FUTURE REFILLS. THANK YOU. 03/09/24   Nahser, Aleene PARAS, MD  meloxicam (MOBIC) 7.5 MG tablet Take 7.5 mg by mouth at bedtime. Patient not taking: Reported on 01/14/2024 07/14/20   [provider]  methocarbamol  (ROBAXIN ) 500 MG tablet Take 1 tablet (500 mg total) by mouth every 6 (six) hours as needed for muscle spasms. Patient not taking: Reported on 01/14/2024 11/19/21   Colon Shove, MD  Multiple Vitamins-Minerals (PRESERVISION AREDS 2+MULTI VIT PO) Take 1 capsule by mouth in the morning and at bedtime.    [provider]  nitroGLYCERIN  (NITROSTAT ) 0.4 MG SL tablet Dissolve 1 tablet under the tongue every 5 minutes as needed for chest pain. Max of 3 doses, then 911. 07/17/24   Thukkani, Arun K, MD  oxyCODONE -acetaminophen  (PERCOCET/ROXICET) 5-325 MG tablet Take by mouth every 4 (four) hours as needed for severe pain. Patient not taking: Reported on 01/14/2024    [provider]  potassium chloride  (KLOR-CON  M10) 10 MEQ tablet TAKE 1 TABLET BY MOUTH EVERY DAY 12/27/23   Nahser, Aleene PARAS, MD  rosuvastatin  (CRESTOR ) 10 MG tablet Take 1 tablet (10 mg total) by mouth daily. 08/21/24   Thukkani, Arun K, MD    Allergies: Lipitor [atorvastatin ], Antivert  [meclizine  hcl], and Iodinated contrast media    Review of Systems  Musculoskeletal:  Positive for arthralgias and myalgias.  Skin:  Positive for wound.  All other systems reviewed and are negative.   Updated Vital Signs BP (!) 138/58 (  BP Location: Right Arm)   Pulse 88   Temp 97.9 F (36.6 C) (Oral)   Resp 20   Ht 5' 6 (1.676 m)   Wt 83.9 kg   SpO2 97%   BMI 29.86 kg/m   Physical Exam Vitals and nursing note reviewed.  Constitutional:      General: He is not in acute distress.    Appearance: Normal appearance. He is not ill-appearing or diaphoretic.     Comments: Visibly shivering.  HENT:     Head: Normocephalic and atraumatic.  Eyes:     General: No scleral  icterus.       Right eye: No discharge.        Left eye: No discharge.     Extraocular Movements: Extraocular movements intact.     Conjunctiva/sclera: Conjunctivae normal.  Neck:     Comments: No midline tenderness to cervical, thoracic, lumbar spine. Cardiovascular:     Rate and Rhythm: Normal rate and regular rhythm.     Pulses: Normal pulses.     Heart sounds: Normal heart sounds. No murmur heard.    No friction rub. No gallop.  Pulmonary:     Effort: Pulmonary effort is normal. No respiratory distress.     Breath sounds: No stridor. No wheezing, rhonchi or rales.  Chest:     Chest wall: Tenderness present.  Abdominal:     General: Abdomen is flat. There is no distension.     Palpations: Abdomen is soft.     Tenderness: There is no abdominal tenderness. There is no right CVA tenderness, left CVA tenderness, guarding or rebound.  Musculoskeletal:        General: Tenderness and signs of injury present. No swelling or deformity.     Cervical back: Normal range of motion. No rigidity or tenderness.     Right lower leg: No edema.     Left lower leg: No edema.     Comments: Skin tears noted to bilateral upper extremities, pictures noted in chart.  Good ROM at wrist, elbows, shoulders, knees, ankles.  Good DP and radial pulses, 2+.  Skin:    General: Skin is warm and dry.     Findings: Bruising (Notably has some bruising along chest wall with some mild chest wall tenderness.) and erythema present. No lesion.     Comments: Notably does have some painful, cellulitic appearing changes to left anterior shin near previous wound site noted to have been earlier this week.  Neurological:     General: No focal deficit present.     Mental Status: He is alert and oriented to person, place, and time. Mental status is at baseline.     Sensory: No sensory deficit.     Motor: No weakness.  Psychiatric:        Mood and Affect: Mood normal.     (all labs ordered are listed, but only abnormal  results are displayed) Labs Reviewed - No data to display  EKG: None  Radiology: No results found.  Procedures   Medications Ordered in the ED - No data to display Clinical Course as of 09/17/24 0000  Sat Sep 16, 2024  2249 Spoke with Dr. Silvester with hospitalist.  Who accepted patient care at this time. [CB]    Clinical Course User Index [CB] Beola Terrall RAMAN, PA-C   {Click here for ABCD2, HEART and other calculators REFRESH Note before signing:1}  Medical Decision Making Amount and/or Complexity of Data Reviewed Labs: ordered. Radiology: ordered.  Risk Prescription drug management. Decision regarding hospitalization.  This patient is a 88 year old male who presents to the ED for concern of mechanical fall, after losing 12 walker, noting to have fallen onto ground, both knees and elbows.  Did not hit head, not on blood thinners.  Additionally noting that he had an injury to left lower extremity where he dropped a hammer and it rebounded and hit his left shin, progressively worse and painful today.  Possibly causing him to have tripped.  Downtime approximately 1 hour.  On physical exam, patient is in no acute distress, afebrile, alert and orient x 4, speaking in full sentences, nontachypneic, nontachycardic.  Notably shivering. Notably does have skin avulsions prevalent throughout both upper extremities with pictures noted in chart.  Did have some minor bruising to anterior chest.  LCTAB, RRR, no murmur.  Abdomen is nontender.  Notably does have some tenderness to left anterior shin, with erythema present and warmth over skin.  Suspicious for cellulitis.  Unremarkable exam otherwise.  Due to patient's fall, and downtime, imaging and labs were done  Case was discussed with attending who consulted on this patient.  Differential diagnoses prior to evaluation: The emergent differential diagnosis includes, but is not limited to, fracture, ligamentous  injury, neurovascular injury, dislocation, malalignment, intracranial bleed. This is not an exhaustive differential.   Past Medical History / Co-morbidities / Social History: CAD, HTN, HLD, nephrolithiasis, GERD  Additional history: Chart reviewed. Pertinent results include:   Last seen by PCP on 01/25/2024  Lab Tests/Imaging studies: I personally interpreted labs/imaging and the pertinent results include:  ***.   ***I agree with the radiologist interpretation.  Cardiac monitoring: EKG obtained and interpreted by myself and attending physician which shows: ***   Medications: I ordered medication including ***.  I have reviewed the patients home medicines and have made adjustments as needed.  Critical Interventions:  Social Determinants of Health:  Disposition: After consideration of the diagnostic results and the patients response to treatment, I feel that the patient would benefit from ***.   ***emergency department workup does not suggest an emergent condition requiring admission or immediate intervention beyond what has been performed at this time. The plan is: ***. The patient is safe for discharge and has been instructed to return immediately for worsening symptoms, change in symptoms or any other concerns.   Final diagnoses:  None    ED Discharge Orders     None

## 2024-09-17 NOTE — Progress Notes (Signed)
 [facility] Progress Note  Patient: Clinton Garza     FMW:[id number]  DOA: 09/16/2024   PCP: [name], NP       Brief hospital course: This is an 88 year old male with issues with chronic lower extremity edema, coronary artery disease, hyperlipidemia, hypertension, CKD who presents to the hospital after a fall. The patient also states that yesterday it was noted by his family that his left leg was bright red.  The patient is blind and was unable to tell.  He does note that he got hit by a hammer on that leg a few days ago. In the ED: Noted to have a WBC count of 14.7-left leg and ankle noted to be swollen and erythematous Also noted to have a number of skin tears on his upper extremities. Started on antibiotics and an ultrasound ordered of the left leg.  Subjective:  Complains of pain in his left leg.  Has no other complaints  Assessment and Plan: Principal Problem:   Cellulitis Of left lower extremity with leukocytosis - Currently on cefazolin -afebrile - WBC count has increased to 19.9 - Blood cultures are pending and will be followed - Keep left leg elevated - Follow-up venous duplex  Active Problems:   Coronary artery disease - Continue Crestor  and aspirin   Lactic acidosis - Lactic acid 3.0 and then 2.6 - Awaiting next lactic acid which has been ordered    Hypertension -Currently normotensive - HCTZ and lisinopril  on hold    CKD (chronic kidney disease), stage III A    Fall at home, initial encounter -PT eval    Skin tear of elbow without complication -Continue dressings    Elevated troponin -Mild troponin elevation with a flat troponin trend-high-sensitivity troponins ranging from 60 to 69 x 4 sets     Code Status: Limited: Do not attempt resuscitation (DNR) -DNR-LIMITED -Do Not Intubate/DNI  Total time on patient care: 40 minutes DVT prophylaxis:  SCDs Start: 09/16/24 2309     Objective:   Vitals:   09/17/24 0039 09/17/24 0425  09/17/24 0846 09/17/24 1144  BP: 128/60 (!) 123/54 122/63   Pulse: 89 87 81 74  Resp: 20 20 20    Temp: 98.6 F (37 C) 98.9 F (37.2 C) 99.6 F (37.6 C)   TempSrc: Oral Oral Oral   SpO2: 98% 96% 96% 100%  Weight: 89.7 kg     Height: 5' 6 (1.676 m)      Filed Weights   09/16/24 1630 09/17/24 0039  Weight: 83.9 kg 89.7 kg   Exam: General exam: Appears comfortable  HEENT: oral mucosa moist Respiratory system: Clear to auscultation.  Cardiovascular system: S1 & S2 heard  Gastrointestinal system: Abdomen soft, non-tender, nondistended. Normal bowel sounds   Extremities: No cyanosis, clubbing-has severe edema of the left ankle and leg with erythema extending from the ankle up to the knee Skin: Extensive bruising, dry and paperthin skin throughout his body Psychiatry:  Mood & affect appropriate.      CBC: Recent Labs  Lab 09/16/24 1742 09/17/24 0307  WBC 14.7* 19.9*  NEUTROABS 12.8*  --   HGB 14.2 12.9*  HCT 43.2 39.8  MCV 100.0 101.3*  PLT 120* 115*   Basic Metabolic Panel: Recent Labs  Lab 09/16/24 1742 09/17/24 0307  NA 140 139  K 4.2 5.1  CL 107 106  CO2 22 21*  GLUCOSE 149* 130*  BUN 36* 40*  CREATININE 1.48* 1.61*  CALCIUM  9.4 9.0  MG  --  2.1  PHOS  --  3.5     Scheduled Meds:  aspirin   81 mg Oral Daily   Gerhardt's butt cream   Topical BID   rosuvastatin   10 mg Oral Daily    Imaging and lab data personally reviewed   Author: Bradden Tadros  09/17/2024 12:06 PM  To contact [facility]>   Check the care team in [facility] and look for the attending/consulting [facility] provider listed  Log into [email or web address] and use 's universal password   Go to> [facility]  and find provider  If you still have difficulty reaching the provider, please page the [facility] (Director on Call) for the Hospitalists listed on amion

## 2024-09-17 NOTE — Progress Notes (Signed)
  Echocardiogram 2D Echocardiogram has been attempted, pt just got put in chair. Has to sit for about an hour then PT will come and put patient back in bed. Will come back around then  Tinnie FORBES Gosling RDCS 09/17/2024, 10:52 AM

## 2024-09-17 NOTE — Progress Notes (Signed)
  Echocardiogram 2D Echocardiogram has been performed.  Tinnie FORBES Gosling RDCS 09/17/2024, 2:15 PM

## 2024-09-17 NOTE — Evaluation (Signed)
 Occupational Therapy Evaluation Patient Details Name: Clinton Garza MRN: [id number] DOB: August 15, 1936 Today's Date: 09/17/2024   History of Present Illness    (Clinton Garza is a 88 yr old male admitted to the hospital after a fall. He was found to have LLE cellulitis and L arm skin tear. PMH: CAD, HTN, HLD, CKD III, thrombocytopenia, hallucinations, arthritis, back surgery)     Clinical Impressions The patient is currently presenting below his baseline level of functioning for self-care management. He is limited by the below listed deficits (see OT problem list). As such, his occupational performance is compromised and he requires assist for self-care management. During the session today, he required mod assist for supine to sit, max assist for lower body dressing, and min assist x2 to stand then step-pivot to the bedside chair using a RW. He was further noted to be with distal LLE redness with pain to touch, as well as unsteadiness in standing and generalized weakness. He will benefit from further OT services to maximize his independence with self-care tasks and to decrease the risk for further weakness and deconditioning. The patient and his family decline SNF rehab, and plan for the patient to return home at discharge with home health therapy and family assistance.     If plan is discharge home, recommend the following:   Help with stairs or ramp for entrance;Assist for transportation;Assistance with cooking/housework;A lot of help with bathing/dressing/bathroom     Functional Status Assessment   Patient has had a recent decline in their functional status and demonstrates the ability to make significant improvements in function in a reasonable and predictable amount of time.     Equipment Recommendations   None recommended by OT     Recommendations for Other Services         Precautions/Restrictions   Precautions Precautions: Fall Restrictions Weight Bearing Restrictions  Per Provider Order: No     Mobility Bed Mobility Overal bed mobility: Needs Assistance Bed Mobility: Supine to Sit     Supine to sit: Mod assist, Used rails, HOB elevated          Transfers Overall transfer level: Needs assistance Equipment used: Rolling walker (2 wheels) Transfers: Sit to/from Stand, Bed to chair/wheelchair/BSC Sit to Stand: Min assist, +2 safety/equipment, From elevated surface     Step pivot transfers: +2 physical assistance, Min assist            Balance     Sitting balance-Leahy Scale: Fair       Standing balance-Leahy Scale: Poor        ADL either performed or assessed with clinical judgement   ADL Overall ADL's : Needs assistance/impaired Eating/Feeding: Set up;Sitting   Grooming: Set up;Sitting   Upper Body Bathing: Set up;Sitting   Lower Body Bathing: Moderate assistance;Sitting/lateral leans   Upper Body Dressing : Minimal assistance;Sitting   Lower Body Dressing: Maximal assistance;Sitting/lateral leans   Toilet Transfer: Moderate assistance;Stand-pivot;BSC/3in1;Rolling walker (2 wheels);Cueing for safety;Cueing for sequencing   Toileting- Clothing Manipulation and Hygiene: Maximal assistance;Sit to/from stand Toileting - Clothing Manipulation Details (indicate cue type and reason): at bedside commode level, based on clinical judgement             Vision   Additional Comments: Per the pt and his spouse, he is legally blind.            Pertinent Vitals/Pain Pain Assessment Pain Assessment: 0-10 Pain Score: 6  Pain Location: LLE Pain Descriptors / Indicators: Sore, Discomfort, Grimacing, Guarding Pain Intervention(s): Limited  activity within patient's tolerance, Monitored during session, Repositioned     Extremity/Trunk Assessment Upper Extremity Assessment Upper Extremity Assessment: Generalized weakness;RUE deficits/detail;LUE deficits/detail;Right hand dominant RUE Deficits / Details: Chronic shoulder AROM  limitations, though not severely limited. Elbow and hand AROM WFL. Arthritic changes of hand. Grip strength 4/5 LUE Deficits / Details: AROM WFL. Arthritic changes of hand. Grip strength 4/5   Lower Extremity Assessment Lower Extremity Assessment: RLE deficits/detail;LLE deficits/detail RLE Deficits / Details:  (AROM and strength WFL) LLE Deficits / Details:  (distal LE redness, hypersensitive and painful to touch. AROM WFL) LLE: Unable to fully assess due to pain       Communication Communication Communication: No apparent difficulties   Cognition Arousal: Alert Behavior During Therapy: WFL for tasks assessed/performed        OT - Cognition Comments: Oriented x4          Following commands: Intact       Cueing  General Comments   Cueing Techniques: Verbal cues;Gestural cues              Home Living Family/patient expects to be discharged to:: Private residence  Living Arrangements: Spouse/significant other  Available Help at Discharge: Family  Type of Home: House  Home Access: Ramped entrance  Entrance Stairs-Number of Steps: 3   Home Layout: One level      Bathroom Shower/Tub: Chief Strategy Officer: Handicapped height     Home Equipment: Agricultural Consultant (2 wheels);BSC/3in1;Lift chair;Tub bench          Prior Functioning/Environment Prior Level of Function : Independent/Modified Independent             Mobility Comments:  (He used a rollator for ambulation. He sleeps in a lift chair.  ADLs Comments:  (He was modified independent to independent with ADLs (takes spongebaths), and his spouse managed the cooking and cleaning. He does not drive, due to impaired vision.)    OT Problem List: Decreased strength;Decreased range of motion;Decreased activity tolerance;Impaired balance (sitting and/or standing);Impaired vision/perception;Decreased knowledge of use of DME or AE;Pain   OT Treatment/Interventions: Self-care/ADL  training;Therapeutic exercise;Energy conservation;Therapeutic activities;DME and/or AE instruction;Balance training;Patient/family education      OT Goals(Current goals can be found in the care plan section)   Acute Rehab OT Goals Patient Stated Goal: the patient desires to return home at discharge OT Goal Formulation: With patient/family Time For Goal Achievement: 10/01/24 Potential to Achieve Goals: Good ADL Goals Pt Will Perform Upper Body Dressing: with set-up;sitting Pt Will Perform Lower Body Dressing: with set-up;with supervision;sit to/from stand Pt Will Transfer to Toilet: with supervision;ambulating Pt Will Perform Toileting - Clothing Manipulation and hygiene: with supervision;sit to/from stand   OT Frequency:  Min 2X/week       AM-PAC OT 6 Clicks Daily Activity     Outcome Measure Help from another person eating meals?: A Little Help from another person taking care of personal grooming?: A Little Help from another person toileting, which includes using toliet, bedpan, or urinal?: A Lot Help from another person bathing (including washing, rinsing, drying)?: A Lot Help from another person to put on and taking off regular upper body clothing?: A Little Help from another person to put on and taking off regular lower body clothing?: A Lot 6 Click Score: 15   End of Session Equipment Utilized During Treatment: Gait belt;Rolling walker (2 wheels) Nurse Communication: Mobility status  Activity Tolerance: Patient limited by pain Patient left: in chair;with call bell/phone within reach;with chair alarm  set;with family/visitor present  OT Visit Diagnosis: Unsteadiness on feet (R26.81);Other abnormalities of gait and mobility (R26.89);History of falling (Z91.81);Pain;Muscle weakness (generalized) (M62.81);Low vision, both eyes (H54.2) Pain - Right/Left: Left Pain - part of body: Leg                Time: 1018-1050 OT Time Calculation (min): 32 min Charges:  OT General  Charges $OT Visit: 1 Visit OT Evaluation $OT Eval Moderate Complexity: 1 Mod OT Treatments $Therapeutic Activity: 8-22 mins   Delanna JINNY Lesches, OTR/L  09/17/2024, 1:52 PM

## 2024-09-17 NOTE — Evaluation (Signed)
 Physical Therapy Evaluation Patient Details Name: Clinton Garza MRN: [id number] DOB: 01-16-1936 Today's Date: 09/17/2024  History of Present Illness   (Mr. Davies is a 88 yr old male admitted to the hospital after a fall. He was found to have LLE cellulitis and L arm skin tear. PMH: CAD, HTN, HLD, CKD III, thrombocytopenia, hallucinations, arthritis, back surgery)  Clinical Impression  Pt admitted with above diagnosis.  Pt agreeable to PT, in recliner on arrival. Pt assisted with short distance amb in room and return to bed. Distance limited by LLE pain however pain and AROM LLE improved as session progressed. Bil LEs elevated in bed EOS. Pt dyspneic with activity--SpO2=98%, HR 76-90 max during session. Will benefit from HHPT at d/c - family present during session and in agreement with plan  Pt currently with functional limitations due to the deficits listed below (see PT Problem List). Pt will benefit from acute skilled PT to increase their independence and safety with mobility to allow discharge.           If plan is discharge home, recommend the following: A little help with walking and/or transfers;A little help with bathing/dressing/bathroom;Help with stairs or ramp for entrance;Assist for transportation;Assistance with cooking/housework   Can travel by private vehicle        Equipment Recommendations None recommended by PT  Recommendations for Other Services       Functional Status Assessment Patient has had a recent decline in their functional status and demonstrates the ability to make significant improvements in function in a reasonable and predictable amount of time.     Precautions / Restrictions Precautions Precautions: Fall Restrictions Weight Bearing Restrictions Per Provider Order: No      Mobility  Bed Mobility Overal bed mobility: Needs Assistance Bed Mobility: Sit to Supine       Sit to supine: Min assist, +2 for safety/equipment   General bed mobility  comments: assist to lift LEs fully on to bed, assist to position trunk/control descent    Transfers Overall transfer level: Needs assistance Equipment used: Rolling walker (2 wheels) Transfers: Sit to/from Stand Sit to Stand: Min assist, +2 safety/equipment           General transfer comment: cues for hand placement    Ambulation/Gait Ambulation/Gait assistance: Min assist Gait Distance (Feet): 6 Feet Assistive device: Rolling walker (2 wheels) Gait Pattern/deviations: Step-to pattern, Decreased stance time - left, Decreased weight shift to left       General Gait Details: assist to steady and manage RW. pt able to take steps fwd and back to bed. verbal cues for RW management, sequence and safety  Stairs            Wheelchair Mobility     Tilt Bed    Modified Rankin (Stroke Patients Only)       Balance Overall balance assessment: Needs assistance, History of Falls Sitting-balance support: Feet supported, No upper extremity supported Sitting balance-Leahy Scale: Fair Sitting balance - Comments: not challenged   Standing balance support: During functional activity, Reliant on assistive device for balance Standing balance-Leahy Scale: Poor                               Pertinent Vitals/Pain Pain Assessment Pain Assessment: Faces Faces Pain Scale: Hurts even more Pain Location: LLE Pain Descriptors / Indicators: Sore, Discomfort, Grimacing, Guarding Pain Intervention(s): Limited activity within patient's tolerance, Monitored during session, Repositioned    Home Living  Family/patient expects to be discharged to:: Private residence (Simultaneous filing. User may not have seen previous data.) Living Arrangements: Spouse/significant other (Simultaneous filing. User may not have seen previous data.) Available Help at Discharge: Family (Simultaneous filing. User may not have seen previous data.) Type of Home: House (Simultaneous filing. User may not  have seen previous data.) Home Access: Ramped entrance (Simultaneous filing. User may not have seen previous data.)   Entrance Stairs-Number of Steps: 3   Home Layout: One level (Simultaneous filing. User may not have seen previous data.) Home Equipment: Rolling Walker (2 wheels);BSC/3in1;Lift chair;Tub bench (Simultaneous filing. User may not have seen previous data.)      Prior Function Prior Level of Function : Independent/Modified Independent             Mobility Comments:  (He used a rollator for ambulation. He sleeps in a lift chair.  Simultaneous filing. User may not have seen previous data.) ADLs Comments:  (He was modified independent to independent with ADLs (takes spongebaths), and his spouse managed the cooking and cleaning. He does not drive, due to impaired vision.)     Extremity/Trunk Assessment   Upper Extremity Assessment Upper Extremity Assessment: Defer to OT evaluation    Lower Extremity Assessment Lower Extremity Assessment: RLE deficits/detail;LLE deficits/detail RLE Deficits / Details: grossly WFL LLE Deficits / Details: edematous, erythematous; painful to touch/movement. MMT hip/knee grossly 3+/5 LLE: Unable to fully assess due to pain       Communication   Communication Communication: No apparent difficulties    Cognition Arousal: Alert Behavior During Therapy: WFL for tasks assessed/performed   PT - Cognitive impairments: No apparent impairments                         Following commands: Intact       Cueing Cueing Techniques: Verbal cues     General Comments      Exercises     Assessment/Plan    PT Assessment Patient needs continued PT services  PT Problem List Decreased strength;Decreased activity tolerance;Decreased balance;Decreased knowledge of use of DME;Pain;Decreased mobility;Decreased safety awareness       PT Treatment Interventions DME instruction;Therapeutic exercise;Gait training;Functional mobility  training;Therapeutic activities;Patient/family education;Balance training    PT Goals (Current goals can be found in the Care Plan section)  Acute Rehab PT Goals Patient Stated Goal: to go home, not rehab PT Goal Formulation: With patient/family Time For Goal Achievement: 10/01/24 Potential to Achieve Goals: Good    Frequency Min 3X/week     Co-evaluation               AM-PAC PT 6 Clicks Mobility  Outcome Measure Help needed turning from your back to your side while in a flat bed without using bedrails?: A Little Help needed moving from lying on your back to sitting on the side of a flat bed without using bedrails?: A Little Help needed moving to and from a bed to a chair (including a wheelchair)?: A Little Help needed standing up from a chair using your arms (e.g., wheelchair or bedside chair)?: A Little Help needed to walk in hospital room?: A Little Help needed climbing 3-5 steps with a railing? : A Lot 6 Click Score: 17    End of Session Equipment Utilized During Treatment: Gait belt Activity Tolerance: Patient tolerated treatment well Patient left: with call bell/phone within reach;in bed;with bed alarm set;with family/visitor present Nurse Communication: Mobility status PT Visit Diagnosis: Other abnormalities of gait  and mobility (R26.89);Pain Pain - Right/Left: Left Pain - part of body: Leg    Time: 8880-8863 PT Time Calculation (min) (ACUTE ONLY): 17 min   Charges:   PT Evaluation $PT Eval Low Complexity: 1 Low   PT General Charges $$ ACUTE PT VISIT: 1 Visit         Khalessi Blough, PT  Acute Rehab Dept [facility]) [phone]  09/17/2024   [facility] 09/17/2024, 1:44 PM

## 2024-09-18 DIAGNOSIS — L03116 Cellulitis of left lower limb: Secondary | ICD-10-CM | POA: Diagnosis not present

## 2024-09-18 LAB — BLOOD CULTURE ID PANEL (REFLEXED) - BCID2

## 2024-09-18 LAB — CBC
HCT: 39.4 % (ref 39.0–52.0)
Hemoglobin: 12.6 g/dL — ABNORMAL LOW (ref 13.0–17.0)
MCH: 32.5 pg (ref 26.0–34.0)
MCHC: 32 g/dL (ref 30.0–36.0)
MCV: 101.5 fL — ABNORMAL HIGH (ref 80.0–100.0)
Platelets: 103 K/uL — ABNORMAL LOW (ref 150–400)
RBC: 3.88 MIL/uL — ABNORMAL LOW (ref 4.22–5.81)
RDW: 13.8 % (ref 11.5–15.5)
WBC: 13 K/uL — ABNORMAL HIGH (ref 4.0–10.5)
nRBC: 0 % (ref 0.0–0.2)

## 2024-09-18 LAB — BASIC METABOLIC PANEL WITH GFR
Anion gap: 10 (ref 5–15)
BUN: 37 mg/dL — ABNORMAL HIGH (ref 8–23)
CO2: 22 mmol/L (ref 22–32)
Calcium: 8.6 mg/dL — ABNORMAL LOW (ref 8.9–10.3)
Chloride: 104 mmol/L (ref 98–111)
Creatinine, Ser: 1.51 mg/dL — ABNORMAL HIGH (ref 0.61–1.24)
GFR, Estimated: 45 mL/min — ABNORMAL LOW (ref >60)
Glucose, Bld: 123 mg/dL — ABNORMAL HIGH (ref 70–99)
Potassium: 4.8 mmol/L (ref 3.5–5.1)
Sodium: 136 mmol/L (ref 135–145)
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
## 2024-09-18 NOTE — Plan of Care (Signed)
  Problem: Clinical Measurements: Goal: Ability to maintain clinical measurements within normal limits will improve Outcome: Progressing Goal: Will remain free from infection Outcome: Progressing Goal: Diagnostic test results will improve Outcome: Progressing Goal: Respiratory complications will improve Outcome: Progressing Goal: Cardiovascular complication will be avoided Outcome: Progressing   Problem: Health Behavior/Discharge Planning: Goal: Ability to manage health-related needs will improve Outcome: Progressing   Problem: Activity: Goal: Risk for activity intolerance will decrease Outcome: Progressing   Problem: Nutrition: Goal: Adequate nutrition will be maintained Outcome: Progressing

## 2024-09-18 NOTE — Progress Notes (Addendum)
 PHARMACY - PHYSICIAN COMMUNICATION CRITICAL VALUE ALERT - BLOOD CULTURE IDENTIFICATION (BCID)  Clinton Garza is an 88 y.o. male who presented to [facility] on 09/16/2024 with a chief complaint of fall, cellulitis  Assessment:  GPC in 1 out of 4 blood cultures, possible GNR. Nothing specific identified on BCID.  Name of physician (or Provider) Contacted: Rizwan  Current antibiotics: Ancef   Changes to prescribed antibiotics recommended:  Patient is on recommended antibiotics - No changes needed  Results for orders placed or performed during the hospital encounter of 09/16/24  Blood Culture ID Panel (Reflexed) (Collected: 09/16/2024 10:24 PM)  Result Value Ref Range   Enterococcus faecalis NOT DETECTED NOT DETECTED   Enterococcus Faecium NOT DETECTED NOT DETECTED   Listeria monocytogenes NOT DETECTED NOT DETECTED   Staphylococcus species NOT DETECTED NOT DETECTED   Staphylococcus aureus (BCID) NOT DETECTED NOT DETECTED   Staphylococcus epidermidis NOT DETECTED NOT DETECTED   Staphylococcus lugdunensis NOT DETECTED NOT DETECTED   Streptococcus species NOT DETECTED NOT DETECTED   Streptococcus agalactiae NOT DETECTED NOT DETECTED   Streptococcus pneumoniae NOT DETECTED NOT DETECTED   Streptococcus pyogenes NOT DETECTED NOT DETECTED   A.calcoaceticus-baumannii NOT DETECTED NOT DETECTED   Bacteroides fragilis NOT DETECTED NOT DETECTED   Enterobacterales NOT DETECTED NOT DETECTED   Enterobacter cloacae complex NOT DETECTED NOT DETECTED   Escherichia coli NOT DETECTED NOT DETECTED   Klebsiella aerogenes NOT DETECTED NOT DETECTED   Klebsiella oxytoca NOT DETECTED NOT DETECTED   Klebsiella pneumoniae NOT DETECTED NOT DETECTED   Proteus species NOT DETECTED NOT DETECTED   Salmonella species NOT DETECTED NOT DETECTED   Serratia marcescens NOT DETECTED NOT DETECTED   Haemophilus influenzae NOT DETECTED NOT DETECTED   Neisseria meningitidis NOT DETECTED NOT DETECTED   Pseudomonas  aeruginosa NOT DETECTED NOT DETECTED   Stenotrophomonas maltophilia NOT DETECTED NOT DETECTED   Candida albicans NOT DETECTED NOT DETECTED   Candida auris NOT DETECTED NOT DETECTED   Candida glabrata NOT DETECTED NOT DETECTED   Candida krusei NOT DETECTED NOT DETECTED   Candida parapsilosis NOT DETECTED NOT DETECTED   Candida tropicalis NOT DETECTED NOT DETECTED   Cryptococcus neoformans/gattii NOT DETECTED NOT DETECTED   Trevonte Ashkar Karoline Marina, PharmD, BCPS Clinical Staff Pharmacist  [facility] 09/18/2024  9:23 AM

## 2024-09-19 DIAGNOSIS — L03116 Cellulitis of left lower limb: Secondary | ICD-10-CM | POA: Diagnosis not present

## 2024-09-19 LAB — CBC
HCT: 38.5 % — ABNORMAL LOW (ref 39.0–52.0)
HCT: 37.4 % — ABNORMAL LOW (ref 39.0–52.0)
Hemoglobin: 12.7 g/dL — ABNORMAL LOW (ref 13.0–17.0)
Hemoglobin: 12.4 g/dL — ABNORMAL LOW (ref 13.0–17.0)
MCH: 32.7 pg (ref 26.0–34.0)
MCH: 33.2 pg (ref 26.0–34.0)
MCHC: 33 g/dL (ref 30.0–36.0)
MCHC: 33.2 g/dL (ref 30.0–36.0)
MCV: 99.2 fL (ref 80.0–100.0)
MCV: 100.3 fL — ABNORMAL HIGH (ref 80.0–100.0)
Platelets: 48 K/uL — ABNORMAL LOW (ref 150–400)
Platelets: 105 K/uL — ABNORMAL LOW (ref 150–400)
RBC: 3.88 MIL/uL — ABNORMAL LOW (ref 4.22–5.81)
RBC: 3.73 MIL/uL — ABNORMAL LOW (ref 4.22–5.81)
RDW: 13.5 % (ref 11.5–15.5)
RDW: 13.5 % (ref 11.5–15.5)
WBC: 11.5 K/uL — ABNORMAL HIGH (ref 4.0–10.5)
WBC: 12 K/uL — ABNORMAL HIGH (ref 4.0–10.5)
nRBC: 0 % (ref 0.0–0.2)
nRBC: 0 % (ref 0.0–0.2)

## 2024-09-19 MED ORDER — CARMEX CLASSIC LIP BALM EX OINT
TOPICAL_OINTMENT | CUTANEOUS | Status: DC | PRN
Start: 2024-09-19 — End: 2024-09-22
  Filled 2024-09-19: qty 10

## 2024-09-19 MED ORDER — SENNA 8.6 MG PO TABS
1.0000 | ORAL_TABLET | Freq: Every day | ORAL | Status: DC
  Administered 2024-09-19 – 2024-09-20 (×2): 8.6 mg via ORAL
  Filled 2024-09-19 (×4): qty 1

## 2024-09-19 NOTE — Progress Notes (Signed)
 Physical Therapy Treatment Patient Details Name: Clinton Garza MRN: [id number] DOB: 1935-11-17 Today's Date: 09/19/2024   History of Present Illness Clinton Garza is a 88 yr old male admitted to the hospital after a fall. He was found to have LLE cellulitis and L arm skin tear. PMH: CAD, HTN, HLD, CKD III, thrombocytopenia, hallucinations, arthritis, back surgery    PT Comments  Pt agreeable to working with therapy. Daughter present during session. Pt required +2 assist for mobility. He was only able to ambulate ~10 feet before having to sit down. He remains at high risk for falls when mobilizing. Feel pt could benefit from short term rehab however pt prefers to d/c home    If plan is discharge home, recommend the following: A lot of help with walking and/or transfers;A lot of help with bathing/dressing/bathroom;Assistance with cooking/housework;Assist for transportation;Help with stairs or ramp for entrance   Can travel by private vehicle        Equipment Recommendations  None recommended by PT    Recommendations for Other Services       Precautions / Restrictions Precautions Precautions: Fall Restrictions Weight Bearing Restrictions Per Provider Order: No     Mobility  Bed Mobility Overal bed mobility: Needs Assistance         Sit to supine: Mod assist, +2 for safety/equipment   General bed mobility comments: assist to lift LEs fully on to bed, assist to position trunk/control descent. increased time. (daughter reports pt sleeps in a lift recliner)    Transfers Overall transfer level: Needs assistance Equipment used: Rolling walker (2 wheels) Transfers: Sit to/from Stand Sit to Stand: Mod assist, +2 physical assistance, +2 safety/equipment           General transfer comment: x 2. Assist to power up, stabilize, control descent. Cues for safety, technique, hand/feet placement. Increased time. Posterior bias on 1st stand-much better on 2nd stand     Ambulation/Gait Ambulation/Gait assistance: Min assist, +2 safety/equipment Gait Distance (Feet): 10 Feet (x2) Assistive device: Rolling walker (2 wheels) Gait Pattern/deviations: Step-to pattern, Decreased step length - right, Decreased step length - left, Antalgic, Decreased weight shift to right, Decreased weight shift to left       General Gait Details: assist to steady and manage RW. followed closely with recliner. seated rest break taken before walk back to bed. cues for safety, rw proximity, sequencing.   Stairs             Wheelchair Mobility     Tilt Bed    Modified Rankin (Stroke Patients Only)       Balance Overall balance assessment: Needs assistance, History of Falls         Standing balance support: During functional activity, Reliant on assistive device for balance Standing balance-Leahy Scale: Poor                              Communication Communication Communication: No apparent difficulties  Cognition Arousal: Alert Behavior During Therapy: WFL for tasks assessed/performed   PT - Cognitive impairments: No apparent impairments                         Following commands: Intact      Cueing Cueing Techniques: Verbal cues  Exercises      General Comments        Pertinent Vitals/Pain Pain Assessment Pain Assessment: Faces Faces Pain Scale: Hurts even more Pain Location:  LLE Pain Descriptors / Indicators: Sore, Discomfort, Grimacing, Guarding Pain Intervention(s): Limited activity within patient's tolerance, Monitored during session, Repositioned    Home Living                          Prior Function            PT Goals (current goals can now be found in the care plan section) Progress towards PT goals: Progressing toward goals    Frequency    Min 3X/week      PT Plan      Co-evaluation              AM-PAC PT 6 Clicks Mobility   Outcome Measure  Help needed turning from  your back to your side while in a flat bed without using bedrails?: A Lot Help needed moving from lying on your back to sitting on the side of a flat bed without using bedrails?: A Lot Help needed moving to and from a bed to a chair (including a wheelchair)?: A Lot Help needed standing up from a chair using your arms (e.g., wheelchair or bedside chair)?: A Lot Help needed to walk in hospital room?: A Lot Help needed climbing 3-5 steps with a railing? : Total 6 Click Score: 11    End of Session Equipment Utilized During Treatment: Gait belt Activity Tolerance: Patient limited by pain;Patient limited by fatigue Patient left: in bed;with call bell/phone within reach;with bed alarm set;with family/visitor present   PT Visit Diagnosis: Muscle weakness (generalized) (M62.81);Difficulty in walking, not elsewhere classified (R26.2);Pain Pain - Right/Left: Left Pain - part of body: Leg     Time: 8584-8561 PT Time Calculation (min) (ACUTE ONLY): 23 min  Charges:    $Gait Training: 23-37 mins PT General Charges $$ ACUTE PT VISIT: 1 Visit                        Clinton Garza, PT Acute Rehabilitation  Office: [phone]
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
## 2024-09-19 NOTE — Plan of Care (Signed)

## 2024-09-19 NOTE — Plan of Care (Signed)

## 2024-09-19 NOTE — Progress Notes (Signed)
 Patient up with 1 assist to stand, was unable to take any real steps, pivoted to chair with walker and 1 assist.  Wife at bedside and states they will need a BSC for home.  MD notified of above.

## 2024-09-20 DIAGNOSIS — L03116 Cellulitis of left lower limb: Secondary | ICD-10-CM | POA: Diagnosis not present

## 2024-09-20 DIAGNOSIS — W19XXXA Unspecified fall, initial encounter: Secondary | ICD-10-CM | POA: Diagnosis not present

## 2024-09-20 LAB — CBC
HCT: 36.8 % — ABNORMAL LOW (ref 39.0–52.0)
Hemoglobin: 11.9 g/dL — ABNORMAL LOW (ref 13.0–17.0)
MCH: 32.4 pg (ref 26.0–34.0)
MCHC: 32.3 g/dL (ref 30.0–36.0)
MCV: 100.3 fL — ABNORMAL HIGH (ref 80.0–100.0)
Platelets: 115 K/uL — ABNORMAL LOW (ref 150–400)
RBC: 3.67 MIL/uL — ABNORMAL LOW (ref 4.22–5.81)
RDW: 13.4 % (ref 11.5–15.5)
WBC: 10.5 K/uL (ref 4.0–10.5)
nRBC: 0 % (ref 0.0–0.2)

## 2024-09-20 MED ORDER — BISACODYL 5 MG PO TBEC
10.0000 mg | DELAYED_RELEASE_TABLET | Freq: Every day | ORAL | Status: DC
  Administered 2024-09-20: 10 mg via ORAL
  Filled 2024-09-20 (×3): qty 2

## 2024-09-20 MED ORDER — BISACODYL 10 MG RE SUPP
10.0000 mg | Freq: Once | RECTAL | Status: DC
  Filled 2024-09-20: qty 1

## 2024-09-20 MED ORDER — POLYETHYLENE GLYCOL 3350 17 G PO PACK
17.0000 g | PACK | Freq: Two times a day (BID) | ORAL | Status: DC
  Administered 2024-09-20 – 2024-09-21 (×2): 17 g via ORAL
  Filled 2024-09-20 (×5): qty 1

## 2024-09-20 NOTE — TOC Initial Note (Addendum)
 Transition of Care [facility]) - Initial/Assessment Note    Patient Details  Name: Clinton Garza MRN: [id number] Date of Birth: 19-Mar-1936  Transition of Care [facility]) CM/SW Contact:    Clinton Glenys DASEN, RN Phone Number: 09/20/2024, 12:40 PM  Clinical Narrative:                 CM met with patient, Clinton Garza (spouse) and Clinton Garza (dlt) in the room to discussed SNF process. Pasty has a list of acceptable SNF and definite no SNF. CM explained the process of how SNF are chosen. They (patient,Pasty,Clinton Garza) agreeable to starting the SNF work-up.  Referrals sent out via HUB. FL2 started.  12:54 PM MUST ID# - [id number], PASRR # - [id number] A 2:44 PM Choice List given to Clinton Garza and Clinton Garza. Service Provider Request Status STARs Address Phone Patient Preferred  [facility] Clinton Garza [facility] Preferred SNF  Accepted 2 [address], [place] [address] [phone]   [facility] Preferred SNF  Accepted 5 [address] [place] [address] [phone]   [facility] [place] Preferred SNF  Accepted [address], [place] [address] [phone]   [facility] [place] Preferred SNF  Accepted 4 [address], [place] [address] [phone]   [facility]  Accepted 2 [address], [place] [address] [phone]   [facility] SN[facility]  Accepted 1 [address], [place] [address] [phone]   [facility] Preferred SNF  Accepted 3 [address] [place] [address] [phone]   [facility]  Accepted 1 [address] [place] [address] [phone]   [facility] Preferred SNF  Accepted 4 [address], [place] [address] [phone]   [facility] CARE SNF  Considering Need bed availability 2 [address] [place] [address] [phone]   [facility] Preferred SNF  Considering 1 [address] [place] [address]  [phone]   [facility], [facility] Preferred SNF  Pending - Request Sent 4 [address], [place] [address] [phone]   [facility] SN[facility]  Pending - Request Sent 3 [address] [place], [place] [address] [phone]   [facility] Preferred SNF  Pending - Request Sent 5 [address] [place] [address] [phone]   [place] Garza[place] Preferred SNF  Pending - Request Sent 5 [address] [place] [address] [phone]   [facility] Preferred SNF  Pending - Request Sent 5 [address], [place] [address] [phone]   [facility] Preferred SNF  Declined No bed availablity 1 [address], [place] [address] [phone]   [facility] PREFERRED SNF  Declined No bed availablity 5 [address], [place] [address] [phone]   [facility] Preferred SNF  Declined 5 [address], [place] [address] [phone]    CM received text from family that they have chosen [facility]. CM updated [facility]wait[facility] for reapply that they have a bed before starting insurance auth.  Expected Discharge Plan: Skilled Nursing Facility Barriers to Discharge: Continued Medical Work up, SNF Pending bed offer   Patient Goals and CMS Choice Patient states their goals for this hospitalization and ongoing recovery are:: Back Home CMS Medicare.gov Compare Post Acute Care list provided to:: Patient Represenative (must comment) (Patient, Clinton Garza(spouse) & Clinton Garza (daughter)) Choice offered to / list presented to : Patient, Spouse, Adult Children [place] ownership interest in [facility].provided to:: Spouse    Expected Discharge Plan and Services   Discharge Planning Services: CM Consult   Living arrangements for the past 2 months: Single Family Home                 DME Arranged: N/A DME Agency: NA       HH Arranged: NA HH  Agency: NA        Prior Living Arrangements/Services Living arrangements for the past 2 months: Single Family Home Lives with:: Spouse Patient language and need for interpreter reviewed:: Yes Do you feel safe going back to the place where you live?: Yes      Need for Family Participation in Patient Care: Yes (Comment) Care giver support system in place?: Yes (comment) Current home services: DME ([facility]) Criminal Activity/Legal Involvement Pertinent to Current Situation/Hospitalization: No - Comment as needed  Activities of Daily Living   ADL Screening (condition at time of admission) Independently performs ADLs?: No Does the patient have a NEW difficulty with bathing/dressing/toileting/self-feeding that is expected to last >3 days?: No Does the patient have a NEW difficulty with getting in/out of bed, walking, or climbing stairs that is expected to last >3 days?: Yes (Initiates electronic notice to provider for possible PT consult) Does the patient have a NEW difficulty with communication that is expected to last >3 days?: No Is the patient deaf or have difficulty hearing?: Yes Does the patient have difficulty seeing, even when wearing glasses/contacts?: Yes Does the patient have difficulty concentrating, remembering, or making decisions?: No  Permission Sought/Granted Permission sought to share information with : Case Manager, Magazine Features Editor, Family Supports Permission granted to share information with : Yes, Verbal Permission Granted  Share Information with NAME: Clinton, Garza [phone], Clinton Garza Clinton Garza (daughter) [phone]  Permission granted to share info w AGENCY: SNFs        Emotional Assessment Appearance:: Appears stated age Attitude/Demeanor/Rapport: Engaged, Gracious Affect (typically observed): Appropriate Orientation: : Oriented to Self, Oriented to Place, Oriented to  Time, Oriented to Situation Alcohol / Substance Use: Not  Applicable Psych Involvement: No (comment)  Admission diagnosis:  Cellulitis [L03.90] Elevated troponin [R79.89] Cellulitis of left lower extremity [L03.116] Skin avulsion [T14.8XXA] Fall, initial encounter [id number]] Leukocytosis, unspecified type [D72.829] Patient Active Problem List   Diagnosis Date Noted   Cellulitis 09/16/2024   CKD (chronic kidney disease), stage III (HCC) 09/16/2024   Fall at home, initial encounter 09/16/2024   Skin tear of elbow without complication 09/16/2024   Elevated troponin 09/16/2024   Left leg swelling 09/16/2024   Nausea & vomiting 08/16/2023   Spondylolisthesis of lumbar region 11/17/2021   Mixed hyperlipidemia 07/23/2017   Coronary artery disease    Hypertension    Dyslipidemia    MI, old    PCP:  [name], NP Pharmacy:   [id number] [place], [place] - [address] [facility] [address] [place] [address] Phone: [phone] Fax: [phone]     Social Drivers of Health (SDOH) Social History: SDOH Screenings   Food Insecurity: No Food Insecurity (09/17/2024)  Housing: Low Risk  (09/17/2024)  Transportation Needs: No Transportation Needs (09/17/2024)  Utilities: Not At Risk (09/17/2024)  Financial Resource Strain: Patient Declined (01/25/2024)   Received from [facility]  Social Connections: Socially Integrated (09/17/2024)  Tobacco Use: Low Risk  (09/16/2024)   SDOH Interventions:     Readmission Risk Interventions    09/20/2024   12:35 PM  Readmission Risk Prevention  Plan  Post Dischage Appt Complete  Medication Screening Complete  Transportation Screening Complete

## 2024-09-20 NOTE — [place] FL2 (Signed)
 [place]  MEDICAID FL2 LEVEL OF CARE FORM     IDENTIFICATION  Patient Name: Clinton Garza Birthdate: 03/27/1936 Sex: male Admission Date (Current Location): 09/16/2024  [facility] and [place] Number:  Producer, Television/film/video and Address:  [facility],  [address] [place], [place] [address]      Provider Number: [id number]  Attending Physician Name and Address:  Will Almarie MATSU, MD  Relative Name and Phone Number:       Current Level of Care: Hospital Recommended Level of Care: Skilled Nursing Facility Prior Approval Number:    Date Approved/Denied: 09/20/24 PASRR Number: [id number]  Discharge Plan: SNF    Current Diagnoses: Patient Active Problem List   Diagnosis Date Noted   Cellulitis 09/16/2024   CKD (chronic kidney disease), stage III (HCC) 09/16/2024   Fall 09/16/2024   Skin tear of elbow without complication 09/16/2024   Elevated troponin 09/16/2024   Left leg swelling 09/16/2024   Nausea & vomiting 08/16/2023   Spondylolisthesis of lumbar region 11/17/2021   Mixed hyperlipidemia 07/23/2017   Coronary artery disease    Hypertension    Dyslipidemia    MI, old     Orientation RESPIRATION BLADDER Height & Weight     Self, Time, Situation, Place  Normal Continent Weight: 89.7 kg Height:  5' 6 (167.6 cm)  BEHAVIORAL SYMPTOMS/MOOD NEUROLOGICAL BOWEL NUTRITION STATUS      Continent Diet  AMBULATORY STATUS COMMUNICATION OF NEEDS Skin   Extensive Assist Verbally Other (Comment) (Left Lower Leg Cellulitis)                       Personal Care Assistance Level of Assistance  Bathing, Feeding, Dressing Bathing Assistance: Limited assistance Feeding assistance: Limited assistance Dressing Assistance: Limited assistance     Functional Limitations Info  Hearing, Speech Sight Info: Impaired (Legally Blind) Hearing Info: Adequate Speech Info: Adequate    SPECIAL CARE FACTORS FREQUENCY  PT (By licensed PT), OT (By licensed OT)     PT  Frequency: 5X weekly OT Frequency: 5x weekly            Contractures Contractures Info: Not present    Additional Factors Info  Code Status, Allergies Code Status Info: DNR Limited Allergies Info: Lipitor (Atorvastatin ), Antivert  (Meclizine  Hcl), Iodinated Contrast Media           Current Medications (09/20/2024):  This is the current hospital active medication list Current Facility-Administered Medications  Medication Dose Route Frequency Provider Last Rate Last Admin   acetaminophen  (TYLENOL ) tablet 650 mg  650 mg Oral Q6H PRN Doutova, Anastassia, MD   650 mg at 09/19/24 2022   Or   acetaminophen  (TYLENOL ) suppository 650 mg  650 mg Rectal Q6H PRN Doutova, Anastassia, MD       bisacodyl  (DULCOLAX) EC tablet 10 mg  10 mg Oral Daily Mathews, Elizabeth G, MD   10 mg at 09/20/24 0943   bisacodyl  (DULCOLAX) suppository 10 mg  10 mg Rectal Once Will Almarie MATSU, MD       ceFAZolin  (ANCEF ) IVPB 2g/100 mL premix  2 g Intravenous Q8H Doutova, Anastassia, MD 200 mL/hr at 09/20/24 0721 2 g at 09/20/24 9278   Gerhardt's butt cream   Topical BID Rizwan, Saima, MD   Given at 09/20/24 1312   HYDROcodone -acetaminophen  (NORCO/VICODIN) 5-325 MG per tablet 1-2 tablet  1-2 tablet Oral Q4H PRN Doutova, Anastassia, MD   2 tablet at 09/20/24 0943   lip balm (CARMEX) ointment   Topical PRN Rizwan,  True, MD       methocarbamol  (ROBAXIN ) injection 500 mg  500 mg Intravenous Q6H PRN Doutova, Anastassia, MD       ondansetron  (ZOFRAN ) tablet 4 mg  4 mg Oral Q6H PRN Doutova, Anastassia, MD       Or   ondansetron  (ZOFRAN ) injection 4 mg  4 mg Intravenous Q6H PRN Doutova, Anastassia, MD       polyethylene glycol (MIRALAX  / GLYCOLAX ) packet 17 g  17 g Oral BID Will Almarie MATSU, MD   17 g at 09/20/24 9056   rosuvastatin  (CRESTOR ) tablet 10 mg  10 mg Oral Daily Doutova, Anastassia, MD   10 mg at 09/20/24 0943   senna (SENOKOT) tablet 8.6 mg  1 tablet Oral Daily Rizwan, Saima, MD   8.6 mg at 09/20/24  9056     Discharge Medications: Please see discharge summary for a list of discharge medications.  Relevant Imaging Results:  Relevant Lab Results:   Additional Information SS# [id number]  Doneta Glenys DASEN, RN

## 2024-09-20 NOTE — Progress Notes (Signed)
 Physical Therapy Treatment Patient Details Name: Clinton Garza MRN: [id number] DOB: [birth year] Today's Date: 09/20/2024   History of Present Illness Clinton Garza is a 88 yr old male admitted to the hospital after a fall. He was found to have LLE cellulitis and L arm skin tear. PMH: CAD, HTN, HLD, CKD III, thrombocytopenia, hallucinations, arthritis, back surgery    PT Comments  Pt agreeable to therapy session. He ambulated ~35 feet with a RW. Pt continues to require +2 assist for safe mobility. He remains at risk for falls. Family present during session-pt and family are now on board with ST rehab-will update recs. Patient will benefit from continued inpatient follow up therapy, <3 hours/day     If plan is discharge home, recommend the following: A lot of help with walking and/or transfers;A lot of help with bathing/dressing/bathroom;Assistance with cooking/housework;Assist for transportation;Help with stairs or ramp for entrance   Can travel by private vehicle     No  Equipment Recommendations  None recommended by PT    Recommendations for Other Services       Precautions / Restrictions Precautions Precautions: Fall Restrictions Weight Bearing Restrictions Per Provider Order: No     Mobility  Bed Mobility Overal bed mobility: Needs Assistance Bed Mobility: Supine to Sit     Supine to sit: Max assist, +2 for safety/equipment, +2 for physical assistance, HOB elevated, Used rails     General bed mobility comments: Assist for trunk and bil LEs. Increased time and effort. HOB elevated and rails used. Cues for safety, technique. Posterior bias intitially once fully at EOB-progressed to [facility] with time    Transfers Overall transfer level: Needs assistance Equipment used: Rolling walker (2 wheels) Transfers: Sit to/from Stand Sit to Stand: Mod assist, +2 physical assistance, +2 safety/equipment, From elevated surface           General transfer comment: Assist to power up,  stabilize, control descent. Cues for safety, technique, hand/feet placement. Increased time. Posterior bias.    Ambulation/Gait Ambulation/Gait assistance: Min assist, +2 safety/equipment Gait Distance (Feet): 35 Feet Assistive device: Rolling walker (2 wheels) Gait Pattern/deviations: Step-to pattern, Decreased step length - right, Decreased step length - left, Antalgic, Decreased weight shift to right, Decreased weight shift to left       General Gait Details: assist to steady and manage RW. followed closely with recliner.  cues for safety, rw proximity, sequencing, posture. used recliner to transport pt back to bedside.   Stairs             Wheelchair Mobility     Tilt Bed    Modified Rankin (Stroke Patients Only)       Balance Overall balance assessment: Needs assistance, History of Falls         Standing balance support: During functional activity, Reliant on assistive device for balance Standing balance-Leahy Scale: Poor                              Communication Communication Communication: No apparent difficulties  Cognition Arousal: Alert Behavior During Therapy: WFL for tasks assessed/performed   PT - Cognitive impairments: No apparent impairments                         Following commands: Intact      Cueing Cueing Techniques: Verbal cues  Exercises      General Comments        Pertinent Vitals/Pain  Pain Assessment Pain Assessment: Faces Faces Pain Scale: Hurts little more Pain Location: LLE Pain Descriptors / Indicators: Sore, Discomfort, Grimacing, Guarding Pain Intervention(s): Limited activity within patient's tolerance, Monitored during session, Repositioned    Home Living                          Prior Function            PT Goals (current goals can now be found in the care plan section) Progress towards PT goals: Progressing toward goals    Frequency    Min 3X/week      PT Plan       Co-evaluation              AM-PAC PT 6 Clicks Mobility   Outcome Measure  Help needed turning from your back to your side while in a flat bed without using bedrails?: A Lot Help needed moving from lying on your back to sitting on the side of a flat bed without using bedrails?: A Lot Help needed moving to and from a bed to a chair (including a wheelchair)?: A Lot Help needed standing up from a chair using your arms (e.g., wheelchair or bedside chair)?: A Lot Help needed to walk in hospital room?: A Lot Help needed climbing 3-5 steps with a railing? : Total 6 Click Score: 11    End of Session Equipment Utilized During Treatment: Gait belt Activity Tolerance: Patient tolerated treatment well;Patient limited by fatigue Patient left: in chair;with call bell/phone within reach;with family/visitor present   PT Visit Diagnosis: Muscle weakness (generalized) (M62.81);Difficulty in walking, not elsewhere classified (R26.2);Pain Pain - Right/Left: Left Pain - part of body: Leg (back)     Time: 8889-8873 PT Time Calculation (min) (ACUTE ONLY): 16 min  Charges:    $Gait Training: 8-22 mins PT General Charges $$ ACUTE PT VISIT: 1 Visit                        Dannial SQUIBB, PT Acute Rehabilitation  Office: [phone]

## 2024-09-20 NOTE — TOC Progression Note (Signed)
 Transition of Care [facility]) - Progression Note    Patient Details  Name: Clinton Garza MRN: [id number] Date of Birth: 1935-12-20  [facility] of [facility] CM/SW Contact  Doneta Glenys DASEN, RN Phone Number: 09/20/2024, 8:41 AM  Clinical Narrative:    CM called [name] (spouse) [phone] to discuss discharge plans and recommendations for SNF verses HH PT. [name] concern for patient returning home is patients need for increase care that she is unable to supply. CM will discuss more later today with spouse and patient.                     Expected Discharge Plan and Services                                               Social Drivers of Health (SDOH) Interventions SDOH Screenings   Food Insecurity: No Food Insecurity (09/17/2024)  Housing: Low Risk  (09/17/2024)  Transportation Needs: No Transportation Needs (09/17/2024)  Utilities: Not At Risk (09/17/2024)  Financial Resource Strain: Patient Declined (01/25/2024)   Received from [facility]  Social Connections: Socially Integrated (09/17/2024)  Tobacco Use: Low Risk  (09/16/2024)    Readmission Risk Interventions     No data to display

## 2024-09-20 NOTE — Progress Notes (Addendum)
 Occupational Therapy Treatment Patient Details Name: Clinton Garza MRN: [id number] DOB: June 10, 1936 Today's Date: 09/20/2024   History of present illness [name] is a 88 yr old male admitted to the hospital after a fall. He was found to have LLE cellulitis and L arm skin tear. PMH: CAD, HTN, HLD, CKD III, thrombocytopenia, hallucinations, arthritis, back surgery   OT comments  Pt making progress with functional goals. Pt in be upon arrival with wife and daughter present. Pt required max A +2 to sit EOB, CGA with grooming/hygiene and UB dressing, mod A with LB bathing. Mod A +2 STS to RW.  OT will continue to follow acutely to maximize level of function and safety. Pt/family  now on board with ST rehab and recommendation had been updated. OT will continue to follow acutely to maximize level of function and safety      If plan is discharge home, recommend the following:  Help with stairs or ramp for entrance;Assist for transportation;Assistance with cooking/housework;A lot of help with bathing/dressing/bathroom;A lot of help with walking and/or transfers   Equipment Recommendations  Other (comment) (defer)    Recommendations for Other Services      Precautions / Restrictions Precautions Precautions: Fall Restrictions Weight Bearing Restrictions Per Provider Order: No       Mobility Bed Mobility Overal bed mobility: Needs Assistance Bed Mobility: Supine to Sit     Supine to sit: Max assist, +2 for safety/equipment, +2 for physical assistance, HOB elevated, Used rails    General bed mobility comments: Assist for trunk and bil LEs. Increased time and effort. HOB elevated and rails used. Cues for safety, technique. Posterior bias intitially once fully at EOB-progressed to [facility] with time    Transfers Overall transfer level: Needs assistance Equipment used: Rolling walker (2 wheels) Transfers: Sit to/from Stand Sit to Stand: Mod assist, +2 physical assistance, +2  safety/equipment, From elevated surface     Step pivot transfers: Min assist, +2 physical assistance     General transfer comment: Assist to power up, stabilize, control descent. Cues for safety, technique, hand/feet placement. Increased time. Posterior bias.     Balance Overall balance assessment: Needs assistance, History of Falls Sitting-balance support: Feet supported, No upper extremity supported Sitting balance-Leahy Scale: Fair     Standing balance support: During functional activity, Reliant on assistive device for balance Standing balance-Leahy Scale: Poor                             ADL either performed or assessed with clinical judgement   ADL Overall ADL's : Needs assistance/impaired     Grooming: Wash/dry hands;Wash/dry face;Set up;Sitting       Lower Body Bathing: Moderate assistance;Sitting/lateral leans   Upper Body Dressing : Contact guard assist;Sitting       Toilet Transfer: Moderate assistance;Minimal assistance;+2 for physical assistance;Ambulation;Rolling walker (2 wheels);Cueing for safety;Cueing for sequencing   Toileting- Clothing Manipulation and Hygiene: Maximal assistance;Sit to/from stand       Functional mobility during ADLs: Moderate assistance;Minimal assistance;+2 for physical assistance;Rolling walker (2 wheels);Cueing for safety;Cueing for sequencing      Extremity/Trunk Assessment Upper Extremity Assessment Upper Extremity Assessment: Generalized weakness;RUE deficits/detail RUE Deficits / Details: Chronic shoulder AROM limitations, though not severely limited. Elbow and hand AROM WFL. Arthritic changes of hand. Grip strength 4/5   Lower Extremity Assessment Lower Extremity Assessment: Defer to PT evaluation        Vision Ability to See in Adequate  Light: 0 Adequate Patient Visual Report: No change from baseline     Perception     Praxis     Communication Communication Communication: No apparent  difficulties Factors Affecting Communication: Hearing impaired   Cognition Arousal: Alert Behavior During Therapy: WFL for tasks assessed/performed               OT - Cognition Comments: pt very pleasant and jovial                 Following commands: Intact        Cueing   Cueing Techniques: Verbal cues  Exercises      Shoulder Instructions       General Comments      Pertinent Vitals/ Pain       Pain Assessment Pain Assessment: Faces Faces Pain Scale: Hurts little more Pain Location: L LE Pain Descriptors / Indicators: Sore, Discomfort, Grimacing, Guarding Pain Intervention(s): Limited activity within patient's tolerance, Monitored during session, Premedicated before session, Repositioned  Home Living                                          Prior Functioning/Environment              Frequency  Min 2X/week        Progress Toward Goals  OT Goals(current goals can now be found in the care plan section)  Progress towards OT goals: Progressing toward goals     Plan      Co-evaluation    PT/OT/SLP Co-Evaluation/Treatment: Yes Reason for Co-Treatment: For patient/therapist safety;To address functional/ADL transfers   OT goals addressed during session: ADL's and self-care;Proper use of Adaptive equipment and DME      AM-PAC OT 6 Clicks Daily Activity     Outcome Measure   Help from another person eating meals?: None Help from another person taking care of personal grooming?: A Little Help from another person toileting, which includes using toliet, bedpan, or urinal?: A Little Help from another person bathing (including washing, rinsing, drying)?: A Lot Help from another person to put on and taking off regular upper body clothing?: A Little Help from another person to put on and taking off regular lower body clothing?: A Lot 6 Click Score: 17    End of Session Equipment Utilized During Treatment: Gait belt;Rolling  walker (2 wheels)  OT Visit Diagnosis: Unsteadiness on feet (R26.81);Other abnormalities of gait and mobility (R26.89);History of falling (Z91.81);Pain;Muscle weakness (generalized) (M62.81);Low vision, both eyes (H54.2) Pain - Right/Left: Left Pain - part of body: Shoulder;Hip   Activity Tolerance Patient limited by fatigue   Patient Left in chair   Nurse Communication Mobility status        Time: 8894-8871 OT Time Calculation (min): 23 min  Charges: OT Treatments $Self Care/Home Management : 8-22 mins    Jacques Karna Loose 09/20/2024, 12:51 PM

## 2024-09-20 NOTE — Plan of Care (Signed)
  Problem: Clinical Measurements: Goal: Will remain free from infection Outcome: Progressing Goal: Respiratory complications will improve Outcome: Progressing Goal: Cardiovascular complication will be avoided Outcome: Progressing   Problem: Nutrition: Goal: Adequate nutrition will be maintained Outcome: Progressing   Problem: Coping: Goal: Level of anxiety will decrease Outcome: Progressing   Problem: Elimination: Goal: Will not experience complications related to urinary retention Outcome: Progressing   Problem: Pain Managment: Goal: General experience of comfort will improve and/or be controlled Outcome: Progressing   Problem: Safety: Goal: Ability to remain free from injury will improve Outcome: Progressing   Problem: Skin Integrity: Goal: Risk for impaired skin integrity will decrease Outcome: Progressing
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
## 2024-09-21 DIAGNOSIS — W19XXXA Unspecified fall, initial encounter: Secondary | ICD-10-CM | POA: Diagnosis not present

## 2024-09-21 DIAGNOSIS — L03116 Cellulitis of left lower limb: Secondary | ICD-10-CM | POA: Diagnosis not present

## 2024-09-21 LAB — CULTURE, BLOOD (ROUTINE X 2): Culture: NO GROWTH

## 2024-09-21 MED ORDER — LISINOPRIL 20 MG PO TABS
20.0000 mg | ORAL_TABLET | Freq: Every day | ORAL | Status: DC
  Administered 2024-09-21 – 2024-09-22 (×2): 20 mg via ORAL
  Filled 2024-09-21 (×2): qty 1

## 2024-09-21 MED ORDER — HYDROCHLOROTHIAZIDE 25 MG PO TABS
25.0000 mg | ORAL_TABLET | Freq: Every day | ORAL | Status: DC
  Administered 2024-09-21 – 2024-09-22 (×2): 25 mg via ORAL
  Filled 2024-09-21 (×2): qty 1

## 2024-09-21 NOTE — TOC Progression Note (Addendum)
 Transition of Care [facility]) - Progression Note    Patient Details  Name: Luisantonio Adinolfi MRN: [id number] Date of Birth: 1935/11/23  [facility] CM/SW Contact  Doneta Glenys DASEN, RN Phone Number: 09/21/2024, 9:32 AM  Clinical Narrative:    Vangie Sora updated CM that will have a bed available. Insurance auth [employer] Auth ID #[id number] 9:40 AM Insurance auth approved to 09/26/2024. [facility] updated on insurance approval. Waiting for reapply on whether [facility] is accepting patient today Thanksgiving.   Expected Discharge Plan: Skilled Nursing Facility Barriers to Discharge: Continued Medical Work up, SNF Pending bed offer               Expected Discharge Plan and Services   Discharge Planning Services: CM Consult   Living arrangements for the past 2 months: Single Family Home                 DME Arranged: N/A DME Agency: NA       HH Arranged: NA HH Agency: NA         Social Drivers of Health (SDOH) Interventions SDOH Screenings   Food Insecurity: No Food Insecurity (09/17/2024)  Housing: Low Risk  (09/17/2024)  Transportation Needs: No Transportation Needs (09/17/2024)  Utilities: Not At Risk (09/17/2024)  Financial Resource Strain: Patient Declined (01/25/2024)   Received from [facility]  Social Connections: Socially Integrated (09/17/2024)  Tobacco Use: Low Risk  (09/16/2024)    Readmission Risk Interventions    09/20/2024   12:35 PM  Readmission Risk Prevention Plan  Post Dischage Appt Complete  Medication Screening Complete  Transportation Screening Complete

## 2024-09-21 NOTE — Plan of Care (Signed)
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
## 2024-09-22 DIAGNOSIS — L03116 Cellulitis of left lower limb: Secondary | ICD-10-CM | POA: Diagnosis not present

## 2024-09-22 LAB — CBC
HCT: 38 % — ABNORMAL LOW (ref 39.0–52.0)
Hemoglobin: 12.4 g/dL — ABNORMAL LOW (ref 13.0–17.0)
MCH: 32.8 pg (ref 26.0–34.0)
MCHC: 32.6 g/dL (ref 30.0–36.0)
MCV: 100.5 fL — ABNORMAL HIGH (ref 80.0–100.0)
Platelets: 132 K/uL — ABNORMAL LOW (ref 150–400)
RBC: 3.78 MIL/uL — ABNORMAL LOW (ref 4.22–5.81)
RDW: 13.5 % (ref 11.5–15.5)
WBC: 9 K/uL (ref 4.0–10.5)
nRBC: 0 % (ref 0.0–0.2)

## 2024-09-22 LAB — BASIC METABOLIC PANEL WITH GFR
Anion gap: 9 (ref 5–15)
BUN: 28 mg/dL — ABNORMAL HIGH (ref 8–23)
CO2: 25 mmol/L (ref 22–32)
Calcium: 8.8 mg/dL — ABNORMAL LOW (ref 8.9–10.3)
Chloride: 104 mmol/L (ref 98–111)
Creatinine, Ser: 1.3 mg/dL — ABNORMAL HIGH (ref 0.61–1.24)
GFR, Estimated: 54 mL/min — ABNORMAL LOW (ref >60)
Glucose, Bld: 126 mg/dL — ABNORMAL HIGH (ref 70–99)
Potassium: 4.6 mmol/L (ref 3.5–5.1)
Sodium: 139 mmol/L (ref 135–145)

## 2024-09-22 MED ORDER — GERHARDT'S BUTT CREAM: 1.0000 | TOPICAL_CREAM | Freq: Two times a day (BID) | CUTANEOUS | Status: AC

## 2024-09-22 MED ORDER — ACETAMINOPHEN 325 MG PO TABS: 650.0000 mg | ORAL_TABLET | Freq: Four times a day (QID) | ORAL | Status: AC | PRN

## 2024-09-22 MED ORDER — AMOXICILLIN-POT CLAVULANATE 500-125 MG PO TABS: 1.0000 | ORAL_TABLET | Freq: Two times a day (BID) | ORAL | 0 refills | Status: AC

## 2024-09-22 MED ORDER — SENNA 8.6 MG PO TABS: 1.0000 | ORAL_TABLET | Freq: Every day | ORAL | 0 refills | Status: AC

## 2024-09-22 MED ORDER — POLYETHYLENE GLYCOL 3350 17 G PO PACK: 17.0000 g | PACK | Freq: Two times a day (BID) | ORAL | 0 refills | Status: AC

## 2024-09-22 MED ORDER — AMOXICILLIN-POT CLAVULANATE 500-125 MG PO TABS: 1.0000 | ORAL_TABLET | Freq: Three times a day (TID) | ORAL | 0 refills | Status: DC

## 2024-09-22 NOTE — TOC Transition Note (Signed)
 Transition of Care [facility]) - Discharge Note   Patient Details  Name: Clinton Garza MRN: [id number] Date of Birth: 08-Feb-1936  [facility] CM/SW Contact:  Doneta Glenys DASEN, RN Phone Number: 09/22/2024, 10:13 AM   Clinical Narrative:    Per MD patient ready for DC to [facility] . RN to call report prior to discharge [phone] [address] Rm 107). RN, patient, patient's family([name]), and facility notified of DC. Discharge Summary and FL2 sent to facility via HUB. DC packet on chart includes face sheet, medical necessity, DNR with shadow chart at nurses desk. Ambulance [facility] transport requested for patient.  Please consult us  again if new needs arise.   Final next level of care: Skilled Nursing Facility Barriers to Discharge: Barriers Resolved   Patient Goals and CMS Choice Patient states their goals for this hospitalization and ongoing recovery are:: [facility] SN[facility] CMS Medicare.gov Compare Post Acute Care list provided to:: Patient Represenative (must comment) (Patient, [name](spouse) & [name] (daughter)) Choice offered to / list presented to : Patient, Spouse, Adult Children [place] ownership interest in [facility].provided to:: Spouse    Discharge Placement              Patient chooses bed at: [facility] Patient to be transferred to facility by: [facility] Name of family member notified: [name] Patient and family notified of of transfer: 09/22/24  Discharge Plan and Services Additional resources added to the After Visit Summary for     Discharge Planning Services: CM Consult            DME Arranged: N/A DME Agency: NA       HH Arranged: NA HH Agency: NA        Social Drivers of Health (SDOH) Interventions SDOH Screenings   Food Insecurity: No Food Insecurity (09/17/2024)  Housing: Low Risk  (09/17/2024)  Transportation Needs: No Transportation Needs (09/17/2024)  Utilities: Not At Risk (09/17/2024)  Financial Resource  Strain: Patient Declined (01/25/2024)   Received from [facility]  Social Connections: Socially Integrated (09/17/2024)  Tobacco Use: Low Risk  (09/16/2024)     Readmission Risk Interventions    09/20/2024   12:35 PM  Readmission Risk Prevention Plan  Post Dischage Appt Complete  Medication Screening Complete  Transportation Screening Complete

## 2024-09-22 NOTE — Plan of Care (Signed)

## 2024-09-22 NOTE — Discharge Summary (Addendum)
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   Serratia marcescens NOT DETECTED NOT DETECTED Final   Haemophilus influenzae NOT DETECTED NOT DETECTED Final   Neisseria meningitidis NOT DETECTED NOT DETECTED Final   Pseudomonas aeruginosa NOT DETECTED NOT DETECTED Final   Stenotrophomonas maltophilia NOT DETECTED NOT DETECTED Final   Candida albicans NOT DETECTED NOT DETECTED Final   Candida auris NOT DETECTED NOT DETECTED Final   Candida glabrata NOT DETECTED NOT DETECTED Final   Candida krusei NOT DETECTED NOT DETECTED Final   Candida parapsilosis NOT DETECTED NOT DETECTED Final   Candida tropicalis NOT DETECTED NOT DETECTED Final   Cryptococcus neoformans/gattii NOT DETECTED NOT DETECTED Final  Comment: Performed at [facility], [address] [place], [place] [address]  MRSA Next Gen by PCR, Nasal     Status: None   Collection Time: 09/17/24  1:34 AM   Specimen: Nasal Mucosa; Nasal Swab  Result Value Ref Range Status   MRSA by PCR Next Gen NOT DETECTED NOT DETECTED Final    Comment: (NOTE) The GeneXpert MRSA Assay (FDA approved for NASAL specimens only), is one component of a comprehensive MRSA colonization surveillance program. It is not intended to diagnose MRSA infection nor to guide or monitor treatment for MRSA infections. Test performance is not FDA approved in patients less than 30 years old. Performed at [facility], [address] [place], [place] [address]      Labs: BNP (last 3 results) No results for input(s): BNP in the last 8760 hours. Basic Metabolic Panel: Recent Labs  Lab 09/16/24 1742 09/17/24 0307 09/18/24 0347 09/22/24 0411  NA 140 139 136 139  K 4.2 5.1 4.8 4.6  CL 107 106 104 104  CO2 22 21* 22 25  GLUCOSE 149* 130* 123* 126*  BUN 36* 40* 37* 28*  CREATININE 1.48* 1.61* 1.51* 1.30*  CALCIUM  9.4 9.0 8.6* 8.8*  MG  --  2.1  --   --   PHOS  --  3.5  --   --    Liver Function Tests: Recent Labs  Lab 09/16/24 1742 09/17/24 0307  AST 51* 66*  ALT 35 48*  ALKPHOS 46 43  BILITOT 1.0 1.2  PROT 6.0* 5.6*  ALBUMIN  3.7 3.4*   No results for input(s): LIPASE, AMYLASE in the last 168 hours. No results for input(s): AMMONIA in the last 168 hours. CBC: Recent Labs  Lab 09/16/24 1742 09/17/24 0307 09/18/24 0347 09/19/24 0358 09/19/24 0745 09/20/24 0452 09/22/24 0411  WBC 14.7*   < > 13.0* 11.5* 12.0* 10.5 9.0  NEUTROABS 12.8*  --   --   --   --   --   --   HGB 14.2   < > 12.6* 12.7* 12.4* 11.9* 12.4*  HCT 43.2   < > 39.4 38.5* 37.4* 36.8* 38.0*  MCV 100.0   < > 101.5* 99.2 100.3* 100.3* 100.5*  PLT 120*   < > 103* 48* 105* 115* 132*   < > = values in this interval not displayed.   Cardiac  Enzymes: Recent Labs  Lab 09/16/24 1742  CKTOTAL 379   BNP: Invalid input(s): POCBNP CBG: No results for input(s): GLUCAP in the last 168 hours. D-Dimer No results for input(s): DDIMER in the last 72 hours. Hgb A1c No results for input(s): HGBA1C in the last 72 hours. Lipid Profile No results for input(s): CHOL, HDL, LDLCALC, TRIG, CHOLHDL, LDLDIRECT in the last 72 hours. Thyroid function studies No results for input(s): TSH, T4TOTAL, T3FREE, THYROIDAB in the last 72 hours.  Invalid input(s): FREET3 Anemia work up No results for input(s): VITAMINB12, FOLATE, FERRITIN, TIBC, IRON, RETICCTPCT in the last 72 hours. Urinalysis    Component Value Date/Time   COLORURINE AMBER (A) 09/17/2024 0514   APPEARANCEUR CLEAR 09/17/2024 0514   LABSPEC 1.025 09/17/2024 0514   PHURINE 5.0 09/17/2024 0514   GLUCOSEU NEGATIVE 09/17/2024 0514   HGBUR NEGATIVE 09/17/2024 0514   BILIRUBINUR NEGATIVE 09/17/2024 0514   KETONESUR NEGATIVE 09/17/2024 0514   PROTEINUR NEGATIVE 09/17/2024 0514   NITRITE NEGATIVE 09/17/2024 0514   LEUKOCYTESUR NEGATIVE 09/17/2024 0514   Sepsis Labs Recent Labs  Lab 09/19/24 0358 09/19/24  0745 09/20/24 0452 09/22/24 0411  WBC 11.5* 12.0* 10.5 9.0   Microbiology Recent Results (from the past 240 hours)  Blood culture (routine x 2)     Status: None   Collection Time: 09/16/24  9:01 PM   Specimen: BLOOD LEFT FOREARM  Result Value Ref Range Status   Specimen Description   Final    BLOOD LEFT FOREARM Performed at [facility], [address] [place], [place] [address]    Special Requests   Final    BOTTLES DRAWN AEROBIC AND ANAEROBIC Blood Culture results may not be optimal due to an inadequate volume of blood received in culture bottles Performed at [facility], [address], [place], [place] [address]    Culture   Final    NO GROWTH 5 DAYS Performed at [facility], [address] [place], [place] [address]    Report Status 09/21/2024 FINAL  Final  Blood culture (routine x 2)     Status: Abnormal (Preliminary result)   Collection Time: 09/16/24 10:24 PM   Specimen: BLOOD RIGHT ARM  Result Value Ref Range Status   Specimen Description   Final    BLOOD RIGHT ARM Performed at [facility], [address], [place], [place] [address]    Special Requests   Final    BOTTLES DRAWN AEROBIC AND ANAEROBIC Blood Culture results may not be optimal due to an inadequate volume of blood received in culture bottles Performed at [facility], [address] [place], [place] [address]    Culture  Setup Time   Final    GRAM POSITIVE COCCI AEROBIC BOTTLE ONLY CRITICAL RESULT CALLED TO, READ BACK BY AND VERIFIED WITH: PHARMD A.PHAM AT 0918 ON 09/18/2024 BY T.SAAD. POSSIBLE GNR PHARMD DREW.W AT 1009 ON 09/18/2024 BY T.SAAD.    Culture (A)  Final    ACINETOBACTER SPECIES TO BE SENT FOR IDENTIFICATION AND SUSCEPTIBILITY TESTING Performed at [facility], [address] [place], [place] [address]    Report Status PENDING  Incomplete  Blood Culture ID Panel (Reflexed)     Status: None   Collection Time: 09/16/24 10:24 PM  Result Value Ref Range Status   Enterococcus faecalis NOT DETECTED NOT DETECTED Final   Enterococcus Faecium NOT DETECTED NOT DETECTED Final   Listeria monocytogenes NOT DETECTED NOT DETECTED Final   Staphylococcus species NOT DETECTED NOT DETECTED Final   Staphylococcus aureus (BCID) NOT DETECTED NOT DETECTED Final   Staphylococcus epidermidis NOT DETECTED NOT DETECTED Final   Staphylococcus lugdunensis NOT DETECTED NOT DETECTED Final   Streptococcus species NOT DETECTED NOT DETECTED Final   Streptococcus agalactiae NOT DETECTED NOT DETECTED Final   Streptococcus pneumoniae NOT DETECTED NOT DETECTED Final   Streptococcus pyogenes NOT DETECTED NOT DETECTED Final   A.calcoaceticus-baumannii NOT DETECTED NOT  DETECTED Final   Bacteroides fragilis NOT DETECTED NOT DETECTED Final   Enterobacterales NOT DETECTED NOT DETECTED Final   Enterobacter cloacae complex NOT DETECTED NOT DETECTED Final   Escherichia coli NOT DETECTED NOT DETECTED Final   Klebsiella aerogenes NOT DETECTED NOT DETECTED Final   Klebsiella oxytoca NOT DETECTED NOT DETECTED Final   Klebsiella pneumoniae NOT DETECTED NOT DETECTED Final   Proteus species NOT DETECTED NOT DETECTED Final   Salmonella species NOT DETECTED NOT DETECTED Final   Serratia marcescens NOT DETECTED NOT DETECTED Final   Haemophilus influenzae NOT DETECTED NOT DETECTED Final   Neisseria meningitidis NOT DETECTED NOT DETECTED Final  Pseudomonas aeruginosa NOT DETECTED NOT DETECTED Final   Stenotrophomonas maltophilia NOT DETECTED NOT DETECTED Final   Candida albicans NOT DETECTED NOT DETECTED Final   Candida auris NOT DETECTED NOT DETECTED Final   Candida glabrata NOT DETECTED NOT DETECTED Final   Candida krusei NOT DETECTED NOT DETECTED Final   Candida parapsilosis NOT DETECTED NOT DETECTED Final   Candida tropicalis NOT DETECTED NOT DETECTED Final   Cryptococcus neoformans/gattii NOT DETECTED NOT DETECTED Final    Comment: Performed at [facility], [address] [place], [place] [address]  MRSA Next Gen by PCR, Nasal     Status: None   Collection Time: 09/17/24  1:34 AM   Specimen: Nasal Mucosa; Nasal Swab  Result Value Ref Range Status   MRSA by PCR Next Gen NOT DETECTED NOT DETECTED Final    Comment: (NOTE) The GeneXpert MRSA Assay (FDA approved for NASAL specimens only), is one component of a comprehensive MRSA colonization surveillance program. It is not intended to diagnose MRSA infection nor to guide or monitor treatment for MRSA infections. Test performance is not FDA approved in patients less than 3 years old. Performed at [facility], [address] [place], [place] [address]      Time coordinating  discharge:39 min SIGNED:   Almarie KANDICE Hoots, MD  [facility] 09/22/2024, 9:39 AM

## 2024-09-28 LAB — MISC LABCORP TEST (SEND OUT): Labcorp test code: 182261

## 2024-09-29 LAB — CULTURE, BLOOD (ROUTINE X 2)

## 2024-11-15 ENCOUNTER — Other Ambulatory Visit: Payer: Self-pay | Admitting: Internal Medicine

## 2024-11-22 MED ORDER — LISINOPRIL 20 MG PO TABS: ORAL_TABLET | ORAL | 0 refills | Status: AC

## 2024-11-22 NOTE — Telephone Encounter (Signed)
 Creatinine Normal on 10/11/24

## 2025-01-12 ENCOUNTER — Ambulatory Visit: Admitting: Internal Medicine
